# Patient Record
Sex: Female | Born: 1985 | Race: White | Hispanic: No | Marital: Single | Smoking: Current every day smoker
Health system: Southern US, Community
[De-identification: ages and names within clinical notes are randomized; demographics above are authoritative.]

## PROBLEM LIST (undated history)

## (undated) ENCOUNTER — Emergency Department (HOSPITAL_COMMUNITY): Disposition: A | Discharge status: Home / Self Care | Payer: No Typology Code available for payment source

## (undated) DIAGNOSIS — K759 Inflammatory liver disease, unspecified: Secondary | ICD-10-CM

## (undated) DIAGNOSIS — B958 Unspecified staphylococcus as the cause of diseases classified elsewhere: Secondary | ICD-10-CM

## (undated) DIAGNOSIS — F419 Anxiety disorder, unspecified: Secondary | ICD-10-CM

## (undated) DIAGNOSIS — A4902 Methicillin resistant Staphylococcus aureus infection, unspecified site: Secondary | ICD-10-CM

## (undated) HISTORY — DX: Unspecified staphylococcus as the cause of diseases classified elsewhere: B95.8

## (undated) HISTORY — PX: NO PAST SURGERIES: SHX2092

## (undated) HISTORY — DX: Inflammatory liver disease, unspecified: K75.9

---

## 2007-12-15 ENCOUNTER — Emergency Department (HOSPITAL_COMMUNITY): Admission: EM | Admit: 2007-12-15 | Discharge: 2007-12-15 | Payer: Self-pay | Admitting: Emergency Medicine

## 2007-12-17 ENCOUNTER — Emergency Department (HOSPITAL_COMMUNITY): Admission: EM | Admit: 2007-12-17 | Discharge: 2007-12-17 | Payer: Self-pay | Admitting: Emergency Medicine

## 2008-11-04 ENCOUNTER — Emergency Department (HOSPITAL_COMMUNITY): Admission: EM | Admit: 2008-11-04 | Discharge: 2008-11-04 | Payer: Self-pay | Admitting: Emergency Medicine

## 2009-05-26 ENCOUNTER — Emergency Department (HOSPITAL_COMMUNITY): Admission: EM | Admit: 2009-05-26 | Discharge: 2009-05-26 | Payer: Self-pay | Admitting: Emergency Medicine

## 2009-07-02 ENCOUNTER — Ambulatory Visit: Payer: Self-pay | Admitting: Family

## 2009-07-02 ENCOUNTER — Inpatient Hospital Stay (HOSPITAL_COMMUNITY): Admission: AD | Admit: 2009-07-02 | Discharge: 2009-07-05 | Payer: Self-pay | Admitting: Obstetrics & Gynecology

## 2010-01-26 ENCOUNTER — Emergency Department (HOSPITAL_COMMUNITY)
Admission: EM | Admit: 2010-01-26 | Discharge: 2010-01-26 | Payer: Self-pay | Source: Home / Self Care | Admitting: Emergency Medicine

## 2010-04-19 LAB — CBC
Platelets: 134 10*3/uL — ABNORMAL LOW (ref 150–400)
RDW: 13.1 % (ref 11.5–15.5)
WBC: 10.8 10*3/uL — ABNORMAL HIGH (ref 4.0–10.5)

## 2010-04-19 LAB — RAPID URINE DRUG SCREEN, HOSP PERFORMED
Cocaine: NOT DETECTED
Opiates: NOT DETECTED
Tetrahydrocannabinol: NOT DETECTED

## 2010-04-19 LAB — RPR: RPR Ser Ql: NONREACTIVE

## 2010-11-03 LAB — CULTURE, ROUTINE-ABSCESS

## 2011-09-01 ENCOUNTER — Emergency Department (HOSPITAL_COMMUNITY): Payer: Self-pay

## 2011-09-01 ENCOUNTER — Encounter (HOSPITAL_COMMUNITY): Payer: Self-pay | Admitting: *Deleted

## 2011-09-01 ENCOUNTER — Emergency Department (HOSPITAL_COMMUNITY)
Admission: EM | Admit: 2011-09-01 | Discharge: 2011-09-01 | Disposition: A | Discharge status: Home / Self Care | Payer: Self-pay | Attending: Emergency Medicine | Admitting: Emergency Medicine

## 2011-09-01 DIAGNOSIS — J40 Bronchitis, not specified as acute or chronic: Secondary | ICD-10-CM | POA: Insufficient documentation

## 2011-09-01 DIAGNOSIS — F17200 Nicotine dependence, unspecified, uncomplicated: Secondary | ICD-10-CM | POA: Insufficient documentation

## 2011-09-01 DIAGNOSIS — Z720 Tobacco use: Secondary | ICD-10-CM

## 2011-09-01 LAB — CBC WITH DIFFERENTIAL/PLATELET
Basophils Absolute: 0 10*3/uL (ref 0.0–0.1)
Eosinophils Absolute: 0.1 10*3/uL (ref 0.0–0.7)
Eosinophils Relative: 1 % (ref 0–5)
Lymphocytes Relative: 16 % (ref 12–46)
Lymphs Abs: 1.2 10*3/uL (ref 0.7–4.0)
MCH: 30.4 pg (ref 26.0–34.0)
MCV: 84.4 fL (ref 78.0–100.0)
Neutrophils Relative %: 76 % (ref 43–77)
Platelets: 182 10*3/uL (ref 150–400)
RBC: 4.87 MIL/uL (ref 3.87–5.11)
RDW: 12.2 % (ref 11.5–15.5)
WBC: 7.8 10*3/uL (ref 4.0–10.5)

## 2011-09-01 LAB — BASIC METABOLIC PANEL
Calcium: 9.7 mg/dL (ref 8.4–10.5)
GFR calc non Af Amer: >90 mL/min (ref >90)
Potassium: 3.6 mEq/L (ref 3.5–5.1)
Sodium: 138 mEq/L (ref 135–145)

## 2011-09-01 LAB — D-DIMER, QUANTITATIVE: D-Dimer, Quant: 0.44 ug/mL-FEU (ref 0.00–0.48)

## 2011-09-01 MED ORDER — ALBUTEROL SULFATE HFA 108 (90 BASE) MCG/ACT IN AERS
2.0000 | INHALATION_SPRAY | RESPIRATORY_TRACT | Status: DC | PRN
  Administered 2011-09-01: 2 via RESPIRATORY_TRACT
  Filled 2011-09-01: qty 6.7

## 2011-09-01 MED ORDER — PREDNISONE 20 MG PO TABS: 20.0000 mg | ORAL_TABLET | Freq: Two times a day (BID) | ORAL | Status: AC

## 2011-09-01 MED ORDER — PREDNISONE 20 MG PO TABS
60.0000 mg | ORAL_TABLET | Freq: Once | ORAL | Status: AC
  Administered 2011-09-01: 60 mg via ORAL
  Filled 2011-09-01: qty 3

## 2011-09-01 MED ORDER — AEROCHAMBER Z-STAT PLUS/MEDIUM MISC
1.0000 | Freq: Once | Status: AC
  Administered 2011-09-01: 1

## 2011-09-01 MED ORDER — IPRATROPIUM BROMIDE 0.02 % IN SOLN
0.5000 mg | Freq: Once | RESPIRATORY_TRACT | Status: AC
  Administered 2011-09-01: 0.5 mg via RESPIRATORY_TRACT
  Filled 2011-09-01: qty 2.5

## 2011-09-01 MED ORDER — ALBUTEROL SULFATE (5 MG/ML) 0.5% IN NEBU
5.0000 mg | INHALATION_SOLUTION | Freq: Once | RESPIRATORY_TRACT | Status: AC
  Administered 2011-09-01: 5 mg via RESPIRATORY_TRACT
  Filled 2011-09-01: qty 1

## 2011-09-01 NOTE — ED Notes (Signed)
Cough, nasal congestion , body aches

## 2011-09-01 NOTE — ED Provider Notes (Signed)
History     CSN: [id number]  Arrival date & time 09/01/11  1345   First MD Initiated Contact with Patient 09/01/11 1354      Chief Complaint  Patient presents with  . Cough    (Consider location/radiation/quality/duration/timing/severity/associated sxs/prior treatment) HPI Comments: Misty Reynolds is a 26 y.o. Female who complains of cough, nonproductive, nasal congestion, myalgias, chest pain, dyspnea, and shortness of breath with walking. Symptoms, onset 2 days ago. She smokes cigarettes, takes oral contraceptives. Has never had asthma, or bronchitis. No nausea, vomiting, weakness, or dizziness. No palliative factors.   Patient is a 26 y.o. female presenting with cough. The history is provided by the patient.  Cough    History reviewed. No pertinent past medical history.  History reviewed. No pertinent past surgical history.  History reviewed. No pertinent family history.  History  Substance Use Topics  . Smoking status: Current Everyday Smoker  . Smokeless tobacco: Not on file  . Alcohol Use: No    OB History    Grav Para Term Preterm Abortions TAB SAB Ect Mult Living                  Review of Systems  Respiratory: Positive for cough.   All other systems reviewed and are negative.    Allergies  Review of patient's allergies indicates no known allergies.  Home Medications   Current Outpatient Rx  Name Route Sig Dispense Refill  . ALPRAZOLAM 0.5 MG PO TABS Oral Take 0.5 mg by mouth 3 (three) times daily.    Marland Kitchen PREDNISONE 20 MG PO TABS Oral Take 1 tablet (20 mg total) by mouth 2 (two) times daily. 10 tablet 0    BP 129/78  Pulse 81  Temp 98.2 F (36.8 C) (Oral)  Resp 20  Ht 5\' 6"  (1.676 m)  Wt 148 lb (67.132 kg)  BMI 23.89 kg/m2  SpO2 98%  LMP 08/31/2011  Physical Exam  Nursing note and vitals reviewed. Constitutional: She is oriented to person, place, and time. She appears well-developed and well-nourished.  HENT:  Head: Normocephalic and  atraumatic.  Eyes: Conjunctivae and EOM are normal. Pupils are equal, round, and reactive to light.  Neck: Normal range of motion and phonation normal. Neck supple.  Cardiovascular: Normal rate, regular rhythm and intact distal pulses.   Pulmonary/Chest: She is in respiratory distress (Mild). She has wheezes. She has no rales. She exhibits no tenderness.       Bilateral rhonchi  Abdominal: Soft. She exhibits no distension. There is no tenderness. There is no guarding.  Musculoskeletal: Normal range of motion.  Neurological: She is alert and oriented to person, place, and time. She has normal strength. She exhibits normal muscle tone.  Skin: Skin is warm and dry.  Psychiatric: She has a normal mood and affect. Her behavior is normal. Judgment and thought content normal.    ED Course  Procedures (including critical care time) Emergency department treatment: Nebulizer, and prednisone.  Reevaluation: 15:47- patient feels better. Repeat vital signs are improved.      Labs Reviewed  BASIC METABOLIC PANEL - Abnormal; Notable for the following:    BUN 4 (*)     All other components within normal limits  CBC WITH DIFFERENTIAL  D-DIMER, QUANTITATIVE   Dg Chest 2 View  09/01/2011  *RADIOLOGY REPORT*  Clinical Data:  Cough, congestion, fever, body aches  CHEST - 2 VIEW  Comparison: None  Findings: The lungs are well-aerated.  Negative for pulmonary edema, focal  airspace consolidation, pulmonary nodule, pleural effusion or pneumothorax.  The cardiac and mediastinal silhouettes within normal limits.  No acute osseous abnormality.  Visualized upper abdomen is unremarkable.  IMPRESSION:  1.  No acute cardiopulmonary disease.  2. Normal chest radiograph.  Original Report Authenticated By: HEATH     1. Tobacco abuse   2. Bronchitis       MDM  Evaluation is consistent with bronchitis. Doubt pneumonia, metabolic instability, or impending vascular collapse.    Plan: Home Medications-  Prednisone, Albuterol; Home Treatments- Stop smoking; Recommended follow up- PCP of choice asap        Flint Melter, MD 09/01/11 1615

## 2011-09-25 ENCOUNTER — Emergency Department (HOSPITAL_COMMUNITY): Payer: Self-pay

## 2011-09-25 ENCOUNTER — Encounter (HOSPITAL_COMMUNITY): Payer: Self-pay | Admitting: *Deleted

## 2011-09-25 ENCOUNTER — Emergency Department (HOSPITAL_COMMUNITY)
Admission: EM | Admit: 2011-09-25 | Discharge: 2011-09-25 | Disposition: A | Discharge status: Home / Self Care | Payer: Self-pay | Attending: Emergency Medicine | Admitting: Emergency Medicine

## 2011-09-25 DIAGNOSIS — L089 Local infection of the skin and subcutaneous tissue, unspecified: Secondary | ICD-10-CM | POA: Insufficient documentation

## 2011-09-25 DIAGNOSIS — F17200 Nicotine dependence, unspecified, uncomplicated: Secondary | ICD-10-CM | POA: Insufficient documentation

## 2011-09-25 DIAGNOSIS — L600 Ingrowing nail: Secondary | ICD-10-CM | POA: Insufficient documentation

## 2011-09-25 MED ORDER — HYDROCODONE-ACETAMINOPHEN 5-325 MG PO TABS
2.0000 | ORAL_TABLET | Freq: Once | ORAL | Status: AC
  Administered 2011-09-25: 2 via ORAL
  Filled 2011-09-25: qty 2

## 2011-09-25 MED ORDER — PROMETHAZINE HCL 12.5 MG PO TABS
12.5000 mg | ORAL_TABLET | Freq: Once | ORAL | Status: AC
  Administered 2011-09-25: 12.5 mg via ORAL
  Filled 2011-09-25: qty 1

## 2011-09-25 MED ORDER — SULFAMETHOXAZOLE-TMP DS 800-160 MG PO TABS
1.0000 | ORAL_TABLET | Freq: Once | ORAL | Status: AC
  Administered 2011-09-25: 1 via ORAL
  Filled 2011-09-25: qty 1

## 2011-09-25 MED ORDER — HYDROCODONE-ACETAMINOPHEN 10-325 MG PO TABS: 2.0000 | ORAL_TABLET | Freq: Once | ORAL | Status: DC

## 2011-09-25 MED ORDER — BACITRACIN ZINC 500 UNIT/GM EX OINT
TOPICAL_OINTMENT | CUTANEOUS | Status: AC
  Administered 2011-09-25: 23:00:00
  Filled 2011-09-25: qty 0.9

## 2011-09-25 MED ORDER — MUPIROCIN CALCIUM 2 % EX CREA
TOPICAL_CREAM | Freq: Two times a day (BID) | CUTANEOUS | Status: DC
  Administered 2011-09-25: 23:00:00 via TOPICAL
  Filled 2011-09-25: qty 15

## 2011-09-25 MED ORDER — BACITRACIN-NEOMYCIN-POLYMYXIN 400-5-5000 EX OINT
TOPICAL_OINTMENT | CUTANEOUS | Status: AC
  Filled 2011-09-25: qty 1

## 2011-09-25 MED ORDER — LIDOCAINE HCL (PF) 1 % IJ SOLN
5.0000 mL | Freq: Once | INTRAMUSCULAR | Status: AC
  Administered 2011-09-25: 5 mL
  Filled 2011-09-25: qty 5

## 2011-09-25 MED ORDER — HYDROCODONE-ACETAMINOPHEN 5-325 MG PO TABS: ORAL_TABLET | ORAL | Status: DC

## 2011-09-25 MED ORDER — SULFAMETHOXAZOLE-TRIMETHOPRIM 800-160 MG PO TABS: 1.0000 | ORAL_TABLET | Freq: Two times a day (BID) | ORAL | Status: AC

## 2011-09-25 NOTE — ED Notes (Signed)
Dressings applied to left 5 th toe and right 4 th finger. Pt tolerated well.

## 2011-09-25 NOTE — ED Provider Notes (Signed)
History     CSN: [id number]  Arrival date & time 09/25/11  2033   First MD Initiated Contact with Patient 09/25/11 2135      Chief Complaint  Patient presents with  . Toe Pain  . Hand Pain    (Consider location/radiation/quality/duration/timing/severity/associated sxs/prior treatment) HPI Comments: Patient presents to the emergency department with complaint of swelling and pain from an ingrown nail in the right fourth finger nail. Patient also complains of pain under the left fifth toe with some swelling. The patient states that the pain and swelling of the right hand and finger have been going on for some time female that is getting progressively worse. She request that the nail be taken off completely because she's had a problem with this in the past and she got along well with the nail being removed. She has not had fever or chills related to this. There's been no red streaks going up the hand.  The patient states that in the last 4 days she has been noticing increasing swelling and soreness of the left fifth toe and under the left fifth toe. She does not recall injury but is not sure if she may have injured the toe or not. She states it feels as though there is some problem in between the toe as well. She has not taken any medications for either of these 2 problems.  Patient is a 26 y.o. female presenting with hand pain. The history is provided by the patient.  Hand Pain Pertinent negatives include no abdominal pain, arthralgias, chest pain, coughing or neck pain.    History reviewed. No pertinent past medical history.  History reviewed. No pertinent past surgical history.  No family history on file.  History  Substance Use Topics  . Smoking status: Current Everyday Smoker -- 0.5 packs/day    Types: Cigarettes  . Smokeless tobacco: Not on file  . Alcohol Use: No    OB History    Grav Para Term Preterm Abortions TAB SAB Ect Mult Living                  Review of Systems    Constitutional: Negative for activity change.       All ROS Neg except as noted in HPI  HENT: Negative for nosebleeds and neck pain.   Eyes: Negative for photophobia and discharge.  Respiratory: Negative for cough, shortness of breath and wheezing.   Cardiovascular: Negative for chest pain and palpitations.  Gastrointestinal: Negative for abdominal pain and blood in stool.  Genitourinary: Negative for dysuria, frequency and hematuria.  Musculoskeletal: Negative for back pain and arthralgias.  Skin: Negative.   Neurological: Negative for dizziness, seizures and speech difficulty.  Psychiatric/Behavioral: Positive for disturbed wake/sleep cycle. Negative for hallucinations and confusion. The patient is nervous/anxious.     Allergies  Review of patient's allergies indicates no known allergies.  Home Medications   Current Outpatient Rx  Name Route Sig Dispense Refill  . ALPRAZOLAM 0.5 MG PO TABS Oral Take 0.5 mg by mouth 3 (three) times daily.      BP 113/67  Pulse 104  Temp 98.4 F (36.9 C) (Oral)  Resp 22  Ht 5\' 6"  (1.676 m)  Wt 150 lb (68.04 kg)  BMI 24.21 kg/m2  SpO2 100%  LMP 09/05/2011  Physical Exam  Nursing note and vitals reviewed. Constitutional: She is oriented to person, place, and time. She appears well-developed and well-nourished.  Non-toxic appearance.  HENT:  Head: Normocephalic.  Right Ear:  Tympanic membrane and external ear normal.  Left Ear: Tympanic membrane and external ear normal.  Eyes: EOM and lids are normal. Pupils are equal, round, and reactive to light.  Neck: Normal range of motion. Neck supple. Carotid bruit is not present.  Cardiovascular: Normal rate, regular rhythm, normal heart sounds, intact distal pulses and normal pulses.   Pulmonary/Chest: Breath sounds normal. No respiratory distress.  Abdominal: Soft. Bowel sounds are normal. There is no tenderness. There is no guarding.  Musculoskeletal: Normal range of motion.       There is an  ingrown nail of the lateral aspect of the right fourth finger. There is mild to moderate increased redness and swelling present. There is full range of motion of the fourth finger. There is no red streaks going up the hand. There is good capillary refill present.  There is athlete's foot rash between the toes of the left foot. There is secondary shallow lacerations under the left fifth toe and in between the fourth and fifth toe. There is soreness to palpation but no red streaking and no drainage appreciated. The foot is not hot. The radial pulse is 2+ and symmetrical.  Lymphadenopathy:       Head (right side): No submandibular adenopathy present.       Head (left side): No submandibular adenopathy present.    She has no cervical adenopathy.  Neurological: She is alert and oriented to person, place, and time. She has normal strength. No cranial nerve deficit or sensory deficit.  Skin: Skin is warm and dry.  Psychiatric: Her speech is normal. Her mood appears anxious.    ED Course  Procedures : REMOVAL OF INGROWN NAIL RIGHT 4TH FINGER. - The patient is identified by arm band. Permission for the procedure is given by the patient. Procedural time out taken before removal of ingrown nail of the right fourth finger. The right fourth finger was painted with Betadine. A digital block was achieved with 1% plain lidocaine. Using sterile technique the lateral corner of the nail was removed a very small amount of pus like material was removed from the tip of the finger. It was then irrigated and painted again with Betadine and a dressing applied. Patient tolerated the procedure without problem.  Labs Reviewed - No data to display Dg Toe 5th Left  09/25/2011  *RADIOLOGY REPORT*  Clinical Data: Pain and burning for weeks.  DG TOE 5TH LEFT  Comparison: None.  Findings: Imaged bones, joints and soft tissues appear normal.  IMPRESSION: Negative exam   Original Report Authenticated By: Bernadene Bell. D'ALESSIO, M.D.       1. Ingrown nail of fourth finger of right hand   2. Skin infection       MDM  I have reviewed nursing notes, vital signs, and all appropriate lab and imaging results for this patient. Patient presented to the emergency department with an ingrown nail of the right fourth finger. The ingrown nail was removed and dressing applied. The patient has a secondary infection from small lacerations under the left fifth toe in between the fourth and fifth toe. An x-ray of the fifth toe is negative for fracture or dislocation. There is no  air seen on the x-ray. The patient is to reviewed with Septra DS one twice daily Bactroban cream to the left foot daily, and Norco 5 mg one every 4 hours as needed for pain #15.       Kathie Dike, [place] 09/25/11 2251

## 2011-09-25 NOTE — ED Provider Notes (Signed)
Medical screening examination/treatment/procedure(s) were performed by non-physician practitioner and as supervising physician I was immediately available for consultation/collaboration.   Shelda Jakes, MD 09/25/11 [phone]

## 2011-09-25 NOTE — ED Notes (Signed)
Pt presents with left 5 th toe pain and swelling. Also c/o ingrown 4 th fingernail. Pt states has had ingrown nails in past.

## 2011-10-20 ENCOUNTER — Emergency Department (HOSPITAL_COMMUNITY): Payer: Self-pay

## 2011-10-20 ENCOUNTER — Encounter (HOSPITAL_COMMUNITY): Payer: Self-pay | Admitting: *Deleted

## 2011-10-20 ENCOUNTER — Emergency Department (HOSPITAL_COMMUNITY)
Admission: EM | Admit: 2011-10-20 | Discharge: 2011-10-21 | Disposition: A | Discharge status: Home / Self Care | Payer: Self-pay | Attending: Emergency Medicine | Admitting: Emergency Medicine

## 2011-10-20 DIAGNOSIS — S161XXA Strain of muscle, fascia and tendon at neck level, initial encounter: Secondary | ICD-10-CM

## 2011-10-20 DIAGNOSIS — M542 Cervicalgia: Secondary | ICD-10-CM | POA: Insufficient documentation

## 2011-10-20 DIAGNOSIS — S335XXA Sprain of ligaments of lumbar spine, initial encounter: Secondary | ICD-10-CM | POA: Insufficient documentation

## 2011-10-20 DIAGNOSIS — S39012A Strain of muscle, fascia and tendon of lower back, initial encounter: Secondary | ICD-10-CM

## 2011-10-20 DIAGNOSIS — S0003XA Contusion of scalp, initial encounter: Secondary | ICD-10-CM | POA: Insufficient documentation

## 2011-10-20 DIAGNOSIS — S0181XA Laceration without foreign body of other part of head, initial encounter: Secondary | ICD-10-CM

## 2011-10-20 DIAGNOSIS — S139XXA Sprain of joints and ligaments of unspecified parts of neck, initial encounter: Secondary | ICD-10-CM | POA: Insufficient documentation

## 2011-10-20 DIAGNOSIS — S0083XA Contusion of other part of head, initial encounter: Secondary | ICD-10-CM

## 2011-10-20 DIAGNOSIS — S0180XA Unspecified open wound of other part of head, initial encounter: Secondary | ICD-10-CM | POA: Insufficient documentation

## 2011-10-20 HISTORY — DX: Anxiety disorder, unspecified: F41.9

## 2011-10-20 MED ORDER — ONDANSETRON 4 MG PO TBDP
4.0000 mg | ORAL_TABLET | Freq: Once | ORAL | Status: AC
  Administered 2011-10-20: 4 mg via ORAL
  Filled 2011-10-20: qty 1

## 2011-10-20 MED ORDER — HYDROMORPHONE HCL PF 1 MG/ML IJ SOLN
1.0000 mg | Freq: Once | INTRAMUSCULAR | Status: AC
  Administered 2011-10-20: 1 mg via INTRAMUSCULAR
  Filled 2011-10-20: qty 1
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
## 2011-10-20 NOTE — ED Notes (Signed)
Family states pt passed out x2. Pt states toes are still numb. Pt left on backboard until seen by EDP.
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
## 2011-10-20 NOTE — ED Notes (Signed)
Family called requesting pain medication for pt. Advised pt has to be seen by EDP & a order placed before any medications can be given. Family of at this time. Pt shows NAD at this time.

## 2011-10-21 MED ORDER — OXYCODONE-ACETAMINOPHEN 5-325 MG PO TABS: ORAL_TABLET | ORAL | Status: DC

## 2011-10-21 NOTE — ED Notes (Signed)
Lac on the right side of forehead, cleaned w/ shurclens. Bleeding still controled.

## 2011-10-21 NOTE — ED Notes (Signed)
Pt alert & oriented x4, stable gait. Patient given discharge instructions, paperwork & prescription(s). Patient  instructed to stop at the registration desk to finish any additional paperwork. Patient verbalized understanding. Pt left department w/ no further questions. 

## 2011-10-21 NOTE — ED Notes (Signed)
Patient requested to use the restroom. Patient not cleared to ambulate at this time, offered patient to used bedpan. Patient asked for family to assist her with using the bedpan.

## 2011-10-22 NOTE — ED Provider Notes (Signed)
Medical screening examination/treatment/procedure(s) were performed by non-physician practitioner and as supervising physician I was immediately available for consultation/collaboration.   Laray Anger, DO 10/22/11 1839

## 2012-02-01 NOTE — L&D Delivery Note (Signed)
Delivery Note At 6:48 PM a viable female was delivered via Vaginal, Spontaneous Delivery (Presentation: Left Occiput Anterior).  APGAR: Pending, crying at perineum , ; weight .  Delivered through thick meconium. Deep suction at warmer because of poost saturations. Placenta status: Intact, Spontaneous.  Cord: 3 vessels with the following complications: None.    Anesthesia: None  Episiotomy: None Lacerations: None Suture Repair: NA Est. Blood Loss (mL): 350  Mom to postpartum.  Baby to Nursery evaluation for inability to maintain O2 sats.  Tawana Scale 11/13/2012, 7:03 PM

## 2012-02-05 ENCOUNTER — Emergency Department (HOSPITAL_COMMUNITY)
Admission: EM | Admit: 2012-02-05 | Discharge: 2012-02-05 | Disposition: A | Discharge status: Home / Self Care | Payer: Self-pay | Attending: Emergency Medicine | Admitting: Emergency Medicine

## 2012-02-05 ENCOUNTER — Encounter (HOSPITAL_COMMUNITY): Payer: Self-pay | Admitting: *Deleted

## 2012-02-05 DIAGNOSIS — N644 Mastodynia: Secondary | ICD-10-CM | POA: Insufficient documentation

## 2012-02-05 DIAGNOSIS — F17200 Nicotine dependence, unspecified, uncomplicated: Secondary | ICD-10-CM | POA: Insufficient documentation

## 2012-02-05 DIAGNOSIS — M545 Low back pain, unspecified: Secondary | ICD-10-CM | POA: Insufficient documentation

## 2012-02-05 DIAGNOSIS — N6459 Other signs and symptoms in breast: Secondary | ICD-10-CM | POA: Insufficient documentation

## 2012-02-05 DIAGNOSIS — R071 Chest pain on breathing: Secondary | ICD-10-CM | POA: Insufficient documentation

## 2012-02-05 DIAGNOSIS — R0789 Other chest pain: Secondary | ICD-10-CM

## 2012-02-05 DIAGNOSIS — N6452 Nipple discharge: Secondary | ICD-10-CM

## 2012-02-05 DIAGNOSIS — R0602 Shortness of breath: Secondary | ICD-10-CM | POA: Insufficient documentation

## 2012-02-05 DIAGNOSIS — Z8619 Personal history of other infectious and parasitic diseases: Secondary | ICD-10-CM | POA: Insufficient documentation

## 2012-02-05 DIAGNOSIS — Z79899 Other long term (current) drug therapy: Secondary | ICD-10-CM | POA: Insufficient documentation

## 2012-02-05 DIAGNOSIS — F411 Generalized anxiety disorder: Secondary | ICD-10-CM | POA: Insufficient documentation

## 2012-02-05 DIAGNOSIS — M79672 Pain in left foot: Secondary | ICD-10-CM

## 2012-02-05 MED ORDER — CYCLOBENZAPRINE HCL 10 MG PO TABS: 10.0000 mg | ORAL_TABLET | Freq: Three times a day (TID) | ORAL | Status: DC | PRN

## 2012-02-05 MED ORDER — SULFAMETHOXAZOLE-TMP DS 800-160 MG PO TABS
1.0000 | ORAL_TABLET | Freq: Once | ORAL | Status: AC
  Administered 2012-02-05: 1 via ORAL
  Filled 2012-02-05: qty 1

## 2012-02-05 MED ORDER — TRAMADOL HCL 50 MG PO TABS
100.0000 mg | ORAL_TABLET | Freq: Once | ORAL | Status: AC
  Administered 2012-02-05: 100 mg via ORAL
  Filled 2012-02-05: qty 2

## 2012-02-05 MED ORDER — CYCLOBENZAPRINE HCL 10 MG PO TABS
10.0000 mg | ORAL_TABLET | Freq: Once | ORAL | Status: AC
  Administered 2012-02-05: 10 mg via ORAL
  Filled 2012-02-05: qty 1

## 2012-02-05 MED ORDER — TRAMADOL HCL 50 MG PO TABS: 100.0000 mg | ORAL_TABLET | Freq: Four times a day (QID) | ORAL | Status: DC | PRN

## 2012-02-05 MED ORDER — KETOROLAC TROMETHAMINE 60 MG/2ML IM SOLN
60.0000 mg | Freq: Once | INTRAMUSCULAR | Status: AC
  Administered 2012-02-05: 60 mg via INTRAMUSCULAR
  Filled 2012-02-05: qty 2

## 2012-02-05 MED ORDER — SULFAMETHOXAZOLE-TRIMETHOPRIM 800-160 MG PO TABS: 1.0000 | ORAL_TABLET | Freq: Two times a day (BID) | ORAL | Status: AC

## 2012-02-05 NOTE — ED Provider Notes (Signed)
History   This chart was scribed for Ward Givens, MD scribed by Magnus Sinning. The patient was seen in room APA12/APA12 at 19:06   CSN: [id number]  Arrival date & time 02/05/12  1758    Chief Complaint  Patient presents with  . Leg Pain  . Breast Pain    (Consider location/radiation/quality/duration/timing/severity/associated sxs/prior treatment) Patient is a 27 y.o. female presenting with leg pain. The history is provided by the patient. No language interpreter was used.  Leg Pain    TAISIA FANTINI is a 27 y.o. female who presents to the Emergency Department complaining of constant moderate sharp tight left-sided CP, onset one week ago with associated SOB and lower back pain.  Pt explains the CP that is described as tightness and is aggravated with breathing and movement,  Says she was seen 2 months ago for a staph infection in left breast. She says she completed a rx [facility] 10 day C[facility] one [facility] ago, and states it did not provide any improvement.   Patient says lower middle back pain that has been hurting for past week along with CP. She states the back pain is modified when sitting up straight, and not bearing weight to back. Also notes that back pain is aggravated when bending forward and toward either left or right side, more prominent on the right.  Pt also has c/o of constant moderate foot pain, onset 5-6 months. She states there is a bump on the bottom of her left foot that intitally appeared as a pimple. States it is better now.   Pt denies cough, fever, or any recent lifting or physical exertion or injury.   PCP: [facility] Department    Past Medical History  Diagnosis Date  . Anxiety     History reviewed. No pertinent past surgical history.  No family history on file.  History  Substance Use Topics  . Smoking status: Current Every Day Smoker -- 0.5 packs/day    Types: Cigarettes  . Smokeless tobacco: Not on file  . Alcohol  Use: No  Patient states she smokes 4-5  Patient is employed as a Marine scientist.  Review of Systems  Constitutional: Negative for fever.  Respiratory: Positive for shortness of breath. Negative for cough.   Cardiovascular: Positive for chest pain.  Musculoskeletal: Positive for back pain.  All other systems reviewed and are negative.    Allergies  Review of patient's allergies indicates no known allergies.  Home Medications   Current Outpatient Rx  Name  Route  Sig  Dispense  Refill  . ALPRAZOLAM 0.5 MG PO TABS   Oral   Take 0.5 mg by mouth 3 (three) times daily.         Marland Kitchen NORGESTIM-ETH ESTRAD TRIPHASIC 0.18/0.215/0.25 MG-35 MCG PO TABS   Oral   Take 1 tablet by mouth daily.           BP 133/80  Pulse 89  Temp 97.8 F (36.6 C) (Oral)  Resp 24  Ht 5' 6.6" (1.692 m)  Wt 143 lb (64.864 kg)  BMI 22.67 kg/m2  SpO2 99%  LMP 01/15/2012  Vital signs normal    Physical Exam  Nursing note and vitals reviewed. Constitutional: She is oriented to person, place, and time. She appears well-developed and well-nourished. No distress.  HENT:  Head: Normocephalic and atraumatic.  Right Ear: External ear normal.  Left Ear: External ear normal.  Nose: Nose normal.  Mouth/Throat: Oropharynx is  clear and moist.  Eyes: Conjunctivae normal and EOM are normal. Pupils are equal, round, and reactive to light.  Neck: Normal range of motion. Neck supple. No tracheal deviation present.  Cardiovascular: Normal rate, regular rhythm and normal heart sounds.   Pulmonary/Chest: Effort normal and breath sounds normal. No respiratory distress. She has no wheezes. She has no rales. She exhibits no tenderness.         When she expresses her left nipple, there is some thin white drainage.   Abdominal: She exhibits no distension.  Musculoskeletal: Normal range of motion.       Back:       Tender in the sacral area bilaterally.Nontender in the thoracic or lumbar spine. Has  pain on ROM of the waist with forward flexion and less so to fexion to the right, no pain with flexion to the left.   Neurological: She is alert and oriented to person, place, and time. No sensory deficit.  Skin: Skin is warm and dry.       On bottom of the left foot on the mid portion location of the MTP joint there is some scaly skin. No induration, no redness, and no drainage. No lesions between her toes as husband state there was.   Psychiatric: She has a normal mood and affect. Her behavior is normal.    ED Course  Procedures (including critical care time)   Medications  sulfamethoxazole-trimethoprim (BACTRIM DS) 800-160 MG per tablet 1 tablet (not administered)  ketorolac (TORADOL) injection 60 mg (60 mg Intramuscular Given 02/05/12 1934)  cyclobenzaprine (FLEXERIL) tablet 10 mg (10 mg Oral Given 02/05/12 1933)  traMADol (ULTRAM) tablet 100 mg (100 mg Oral Given 02/05/12 1933)    DIAGNOSTIC STUDIES: Oxygen Saturation is 99% on room air, normal by my interpretation.    COORDINATION OF CARE: 19:14: Physical exam performed.    1. LBP (low back pain)   2. Discharge from nipple   3. Chest wall pain   4. Foot pain, left    New Prescriptions   CYCLOBENZAPRINE (FLEXERIL) 10 MG TABLET    Take 1 tablet (10 mg total) by mouth 3 (three) times daily as needed for muscle spasms.   SULFAMETHOXAZOLE-TRIMETHOPRIM (BACTRIM DS,SEPTRA DS) 800-160 MG PER TABLET    Take 1 tablet by mouth 2 (two) times daily.   TRAMADOL (ULTRAM) 50 MG TABLET    Take 2 tablets (100 mg total) by mouth every 6 (six) hours as needed for pain.    Plan discharge  Devoria Albe, MD, FACEP     MDM   I personally performed the services described in this documentation, which was scribed in my presence. The recorded information has been reviewed and considered.  Devoria Albe, MD, Armando Gang         Ward Givens, MD 02/05/12 [phone]

## 2012-02-05 NOTE — ED Notes (Signed)
Pt with nipple discharge to left breast when breast squeezed, seen [facility] and has finished antibiotics for it; c/o pain to bottom of left foot; here for bruise noted to posterior of left thigh after a fall yesterday, denies hitting head

## 2012-02-05 NOTE — ED Notes (Signed)
Infection to left breast x 2 wks.  Reports taking PO abx with no relief.  Pt reports falling today and c/o pain to posterior left thigh.  Also c/o pain to left foot.

## 2012-06-27 ENCOUNTER — Other Ambulatory Visit: Payer: Self-pay | Admitting: Obstetrics & Gynecology

## 2012-06-27 DIAGNOSIS — O0932 Supervision of pregnancy with insufficient antenatal care, second trimester: Secondary | ICD-10-CM

## 2012-07-02 ENCOUNTER — Encounter: Payer: Self-pay | Admitting: *Deleted

## 2012-07-03 ENCOUNTER — Ambulatory Visit (INDEPENDENT_AMBULATORY_CARE_PROVIDER_SITE_OTHER): Payer: Self-pay

## 2012-07-03 ENCOUNTER — Other Ambulatory Visit: Payer: Self-pay | Admitting: Obstetrics & Gynecology

## 2012-07-03 DIAGNOSIS — O99320 Drug use complicating pregnancy, unspecified trimester: Secondary | ICD-10-CM

## 2012-07-03 DIAGNOSIS — F1920 Other psychoactive substance dependence, uncomplicated: Secondary | ICD-10-CM

## 2012-07-03 DIAGNOSIS — O0932 Supervision of pregnancy with insufficient antenatal care, second trimester: Secondary | ICD-10-CM

## 2012-07-03 DIAGNOSIS — O0930 Supervision of pregnancy with insufficient antenatal care, unspecified trimester: Secondary | ICD-10-CM

## 2012-07-03 NOTE — Progress Notes (Signed)
U/S-active fetus, meas c/w 20+1wks EDD 11/19/2012, fluid wnl, ant gr 1. Anat screen complete.cx closed  = 5.2 cm, female fetus

## 2012-07-11 LAB — US OB DETAIL + 14 WK

## 2012-07-17 ENCOUNTER — Encounter: Payer: Self-pay | Admitting: Women's Health

## 2012-07-17 ENCOUNTER — Other Ambulatory Visit: Payer: Self-pay | Admitting: Women's Health

## 2012-07-17 ENCOUNTER — Ambulatory Visit (INDEPENDENT_AMBULATORY_CARE_PROVIDER_SITE_OTHER): Payer: Medicaid Other | Admitting: Women's Health

## 2012-07-17 VITALS — BP 108/70 | Ht 65.0 in | Wt 164.0 lb

## 2012-07-17 DIAGNOSIS — O99330 Smoking (tobacco) complicating pregnancy, unspecified trimester: Secondary | ICD-10-CM

## 2012-07-17 DIAGNOSIS — Z3480 Encounter for supervision of other normal pregnancy, unspecified trimester: Secondary | ICD-10-CM | POA: Insufficient documentation

## 2012-07-17 DIAGNOSIS — Z3482 Encounter for supervision of other normal pregnancy, second trimester: Secondary | ICD-10-CM

## 2012-07-17 DIAGNOSIS — O0932 Supervision of pregnancy with insufficient antenatal care, second trimester: Secondary | ICD-10-CM

## 2012-07-17 DIAGNOSIS — R05 Cough: Secondary | ICD-10-CM

## 2012-07-17 DIAGNOSIS — F17200 Nicotine dependence, unspecified, uncomplicated: Secondary | ICD-10-CM | POA: Insufficient documentation

## 2012-07-17 DIAGNOSIS — Z1389 Encounter for screening for other disorder: Secondary | ICD-10-CM

## 2012-07-17 DIAGNOSIS — O99340 Other mental disorders complicating pregnancy, unspecified trimester: Secondary | ICD-10-CM

## 2012-07-17 DIAGNOSIS — O0930 Supervision of pregnancy with insufficient antenatal care, unspecified trimester: Secondary | ICD-10-CM | POA: Insufficient documentation

## 2012-07-17 DIAGNOSIS — F418 Other specified anxiety disorders: Secondary | ICD-10-CM | POA: Insufficient documentation

## 2012-07-17 DIAGNOSIS — Z331 Pregnant state, incidental: Secondary | ICD-10-CM

## 2012-07-17 DIAGNOSIS — F419 Anxiety disorder, unspecified: Secondary | ICD-10-CM

## 2012-07-17 LAB — POCT URINALYSIS DIPSTICK
Ketones, UA: NEGATIVE
Protein, UA: NEGATIVE

## 2012-07-17 MED ORDER — HYDROCODONE-HOMATROPINE 5-1.5 MG/5ML PO SYRP: 2.5000 mL | ORAL_SOLUTION | Freq: Every evening | ORAL | Status: DC | PRN

## 2012-07-17 NOTE — Progress Notes (Signed)
Low backpain. New OB packet given. Consents signed. AFP today. To sign record release for [place] to get pap smear results.

## 2012-07-17 NOTE — Progress Notes (Addendum)
  Subjective:    Misty Reynolds is a 27 y.o. G75P1001 Caucasian female at [redacted]w[redacted]d by 20.1wk u/s, being seen today for her first obstetrical visit.  Her obstetrical history is significant for smoker and late onset care, uncomplicated term svd x 1.  Pregnancy history fully reviewed. Trying to wean Xanax down, now taking 0.5mg  BID, but doesn't want to come completely off. Counseled on xanax use during pregnancy and risks to fetus, and alternative meds that are safer during pregnancy.   Patient reports persistent dry coughing nightly that keeps her up- has tried OTC cough medicine w/o relief, states same thing happened last pregnancy and she was given cough syrup which helped- requests this again.  Denies cramping, vb, lof, urinary hesitancy, frequency, urgency, or dysuria. Reports good fm.   Filed Vitals:   07/17/12 1609  BP: 108/70  Weight: 164 lb (74.39 kg)    HISTORY: OB History   Grav Para Term Preterm Abortions TAB SAB Ect Mult Living   2 1 1       1      # Outc Date GA Lbr Len/2nd Wgt Sex Del Anes PTL Lv   1 TRM 6/11 [redacted]w[redacted]d  7lb8oz(3.402kg) M SVD   Yes   2 CUR              Past Medical History  Diagnosis Date  . Anxiety   . Staph infection     left breast   Past Surgical History  Procedure Laterality Date  . No past surgeries     History reviewed. No pertinent family history.   Exam   System:     Skin: normal coloration and turgor, no rashes    Neurologic: oriented, normal mood   Extremities: normal strength, tone, and muscle mass   HEENT PERRLA   Mouth/Teeth mucous membranes moist   Cardiovascular: regular rate and rhythm   Respiratory:  appears well, vitals normal, no respiratory distress, acyanotic, normal RR   Abdomen: soft, non-tender    Thin prep pap smear not obtained, pt states she had a recent normal pap FH: 21cm FHR: 130 via doppler Assessment:    Pregnancy: G2P1001 Patient Active Problem List   Diagnosis Date Noted  . Supervision of other normal  pregnancy 07/17/2012    Priority: High  . Late prenatal care 07/17/2012    Priority: High  . Anxiety 07/17/2012    Priority: High  . Smoker 07/17/2012      [redacted]w[redacted]d G2P1001 New OB visit Smoker Anxiety Late onset care  Plan:     Initial labs drawn Continue prenatal vitamins Problem list reviewed and updated Reviewed ptl s/s, fm, warning s/s to report Reviewed recommended weight gain based on pre-gravid BMI Encouraged well-balanced diet Genetic Screening discussed Quad Screen: requested, drawn today Cystic fibrosis screening discussed declined Ultrasound discussed; fetal survey: results reviewed Rx Hycodan 2.80ml q hs prn cough, w/ 0RF To sign release to get pap results Recommended smoking cessation Follow up in 3 weeks for visit Marge Duncans 07/17/2012 4:47 PM

## 2012-07-17 NOTE — Patient Instructions (Addendum)
Pregnancy - Second Trimester The second trimester of pregnancy (3 to 6 months) is a period of rapid growth for you and your baby. At the end of the sixth month, your baby is about 9 inches long and weighs 1 1/2 pounds. You will begin to feel the baby move between 18 and 20 weeks of the pregnancy. This is called quickening. Weight gain is faster. A clear fluid (colostrum) may leak out of your breasts. You may feel small contractions of the womb (uterus). This is known as false labor or Braxton-Hicks contractions. This is like a practice for labor when the baby is ready to be born. Usually, the problems with morning sickness have usually passed by the end of your first trimester. Some women develop small dark blotches (called cholasma, mask of pregnancy) on their face that usually goes away after the baby is born. Exposure to the sun makes the blotches worse. Acne may also develop in some pregnant women and pregnant women who have acne, may find that it goes away. PRENATAL EXAMS  Blood work may continue to be done during prenatal exams. These tests are done to check on your health and the probable health of your baby. Blood work is used to follow your blood levels (hemoglobin). Anemia (low hemoglobin) is common during pregnancy. Iron and vitamins are given to help prevent this. You will also be checked for diabetes between 24 and 28 weeks of the pregnancy. Some of the previous blood tests may be repeated.  The size of the uterus is measured during each visit. This is to make sure that the baby is continuing to grow properly according to the dates of the pregnancy.  Your blood pressure is checked every prenatal visit. This is to make sure you are not getting toxemia.  Your urine is checked to make sure you do not have an infection, diabetes or protein in the urine.  Your weight is checked often to make sure gains are happening at the suggested rate. This is to ensure that both you and your baby are  growing normally.  Sometimes, an ultrasound is performed to confirm the proper growth and development of the baby. This is a test which bounces harmless sound waves off the baby so your caregiver can more accurately determine due dates. Sometimes, a test is done on the amniotic fluid surrounding the baby. This test is called an amniocentesis. The amniotic fluid is obtained by sticking a needle into the belly (abdomen). This is done to check the chromosomes in instances where there is a concern about possible genetic problems with the baby. It is also sometimes done near the end of pregnancy if an early delivery is required. In this case, it is done to help make sure the baby's lungs are mature enough for the baby to live outside of the womb. CHANGES OCCURING IN THE SECOND TRIMESTER OF PREGNANCY Your body goes through many changes during pregnancy. They vary from person to person. Talk to your caregiver about changes you notice that you are concerned about.  During the second trimester, you will likely have an increase in your appetite. It is normal to have cravings for certain foods. This varies from person to person and pregnancy to pregnancy.  Your lower abdomen will begin to bulge.  You may have to urinate more often because the uterus and baby are pressing on your bladder. It is also common to get more bladder infections during pregnancy. You can help this by drinking lots of fluids   and emptying your bladder before and after intercourse.  You may begin to get stretch marks on your hips, abdomen, and breasts. These are normal changes in the body during pregnancy. There are no exercises or medicines to take that prevent this change.  You may begin to develop swollen and bulging veins (varicose veins) in your legs. Wearing support hose, elevating your feet for 15 minutes, 3 to 4 times a day and limiting salt in your diet helps lessen the problem.  Heartburn may develop as the uterus grows and  pushes up against the stomach. Antacids recommended by your caregiver helps with this problem. Also, eating smaller meals 4 to 5 times a day helps.  Constipation can be treated with a stool softener or adding bulk to your diet. Drinking lots of fluids, and eating vegetables, fruits, and whole grains are helpful.  Exercising is also helpful. If you have been very active up until your pregnancy, most of these activities can be continued during your pregnancy. If you have been less active, it is helpful to start an exercise program such as walking.  Hemorrhoids may develop at the end of the second trimester. Warm sitz baths and hemorrhoid cream recommended by your caregiver helps hemorrhoid problems.  Backaches may develop during this time of your pregnancy. Avoid heavy lifting, wear low heal shoes, and practice good posture to help with backache problems.  Some pregnant women develop tingling and numbness of their hand and fingers because of swelling and tightening of ligaments in the wrist (carpel tunnel syndrome). This goes away after the baby is born.  As your breasts enlarge, you may have to get a bigger bra. Get a comfortable, cotton, support bra. Do not get a nursing bra until the last month of the pregnancy if you will be nursing the baby.  You may get a dark line from your belly button to the pubic area called the linea nigra.  You may develop rosy cheeks because of increase blood flow to the face.  You may develop spider looking lines of the face, neck, arms, and chest. These go away after the baby is born. HOME CARE INSTRUCTIONS   It is extremely important to avoid all smoking, herbs, alcohol, and unprescribed drugs during your pregnancy. These chemicals affect the formation and growth of the baby. Avoid these chemicals throughout the pregnancy to ensure the delivery of a healthy infant.  Most of your home care instructions are the same as suggested for the first trimester of your  pregnancy. Keep your caregiver's appointments. Follow your caregiver's instructions regarding medicine use, exercise, and diet.  During pregnancy, you are providing food for you and your baby. Continue to eat regular, well-balanced meals. Choose foods such as meat, fish, milk and other low fat dairy products, vegetables, fruits, and whole-grain breads and cereals. Your caregiver will tell you of the ideal weight gain.  A physical sexual relationship may be continued up until near the end of pregnancy if there are no other problems. Problems could include early (premature) leaking of amniotic fluid from the membranes, vaginal bleeding, abdominal pain, or other medical or pregnancy problems.  Exercise regularly if there are no restrictions. Check with your caregiver if you are unsure of the safety of some of your exercises. The greatest weight gain will occur in the last 2 trimesters of pregnancy. Exercise will help you:  Control your weight.  Get you in shape for labor and delivery.  Lose weight after you have the baby.  Wear   a good support or jogging bra for breast tenderness during pregnancy. This may help if worn during sleep. Pads or tissues may be used in the bra if you are leaking colostrum.  Do not use hot tubs, steam rooms or saunas throughout the pregnancy.  Wear your seat belt at all times when driving. This protects you and your baby if you are in an accident.  Avoid raw meat, uncooked cheese, cat litter boxes, and soil used by cats. These carry germs that can cause birth defects in the baby.  The second trimester is also a good time to visit your dentist for your dental health if this has not been done yet. Getting your teeth cleaned is okay. Use a soft toothbrush. Brush gently during pregnancy.  It is easier to leak urine during pregnancy. Tightening up and strengthening the pelvic muscles will help with this problem. Practice stopping your urination while you are going to the  bathroom. These are the same muscles you need to strengthen. It is also the muscles you would use as if you were trying to stop from passing gas. You can practice tightening these muscles up 10 times a set and repeating this about 3 times per day. Once you know what muscles to tighten up, do not perform these exercises during urination. It is more likely to contribute to an infection by backing up the urine.  Ask for help if you have financial, counseling, or nutritional needs during pregnancy. Your caregiver will be able to offer counseling for these needs as well as refer you for other special needs.  Your skin may become oily. If so, wash your face with mild soap, use non-greasy moisturizer and oil or cream based makeup. MEDICINES AND DRUG USE IN PREGNANCY  Take prenatal vitamins as directed. The vitamin should contain 1 milligram of folic acid. Keep all vitamins out of reach of children. Only a couple vitamins or tablets containing iron may be fatal to a baby or young child when ingested.  Avoid use of all medicines, including herbs, over-the-counter medicines, not prescribed or suggested by your caregiver. Only take over-the-counter or prescription medicines for pain, discomfort, or fever as directed by your caregiver. Do not use aspirin.  Let your caregiver also know about herbs you may be using.  Alcohol is related to a number of birth defects. This includes fetal alcohol syndrome. All alcohol, in any form, should be avoided completely. Smoking will cause low birth rate and premature babies.  Street or illegal drugs are very harmful to the baby. They are absolutely forbidden. A baby born to an addicted mother will be addicted at birth. The baby will go through the same withdrawal an adult does. SEEK MEDICAL CARE IF:  You have any concerns or worries during your pregnancy. It is better to call with your questions if you feel they cannot wait, rather than worry about them. SEEK IMMEDIATE  MEDICAL CARE IF:   An unexplained oral temperature above 102 F (38.9 C) develops, or as your caregiver suggests.  You have leaking of fluid from the vagina (birth canal). If leaking membranes are suspected, take your temperature and tell your caregiver of this when you call.  There is vaginal spotting, bleeding, or passing clots. Tell your caregiver of the amount and how many pads are used. Light spotting in pregnancy is common, especially following intercourse.  You develop a bad smelling vaginal discharge with a change in the color from clear to white.  You continue to feel   sick to your stomach (nauseated) and have no relief from remedies suggested. You vomit blood or coffee ground-like materials.  You lose more than 2 pounds of weight or gain more than 2 pounds of weight over 1 week, or as suggested by your caregiver.  You notice swelling of your face, hands, feet, or legs.  You get exposed to German measles and have never had them.  You are exposed to fifth disease or chickenpox.  You develop belly (abdominal) pain. Round ligament discomfort is a common non-cancerous (benign) cause of abdominal pain in pregnancy. Your caregiver still must evaluate you.  You develop a bad headache that does not go away.  You develop fever, diarrhea, pain with urination, or shortness of breath.  You develop visual problems, blurry, or double vision.  You fall or are in a car accident or any kind of trauma.  There is mental or physical violence at home. Document Released: 01/11/2001 Document Revised: 10/12/2011 Document Reviewed: 07/16/2008 [employer] Patient Information 2014 [employer].  

## 2012-07-18 LAB — AFP, QUAD SCREEN
Age Alone: 1:1050 {titer}
HCG, Total: 11791 m[IU]/mL
INH: 268.4 pg/mL
MoM for hCG: 0.91
Osb Risk: 1:2750 {titer}
Tri 18 Scr Risk Est: NEGATIVE
Trisomy 18 (Edward) Syndrome Interp.: 1:94100 {titer}
uE3 Value: 1.8 ng/mL

## 2012-07-18 LAB — DRUG SCREEN, URINE, NO CONFIRMATION
Amphetamine Screen, Ur: NEGATIVE
Barbiturate Quant, Ur: NEGATIVE
Cocaine Metabolites: NEGATIVE
Marijuana Metabolite: NEGATIVE
Opiate Screen, Urine: POSITIVE — AB

## 2012-07-18 LAB — URINALYSIS
Glucose, UA: NEGATIVE mg/dL
Hgb urine dipstick: NEGATIVE
pH: 6.5 (ref 5.0–8.0)

## 2012-07-18 LAB — CBC
Hemoglobin: 12.7 g/dL (ref 12.0–15.0)
MCH: 30.3 pg (ref 26.0–34.0)
MCV: 89 fL (ref 78.0–100.0)
RBC: 4.19 MIL/uL (ref 3.87–5.11)

## 2012-07-18 LAB — HIV ANTIBODY (ROUTINE TESTING W REFLEX): HIV: NONREACTIVE

## 2012-07-19 LAB — OPIATES/OPIOIDS (LC/MS-MS)
Codeine Urine: 70 ng/mL
Heroin (6-AM), UR: NEGATIVE ng/mL
Hydrocodone: 262 ng/mL
Hydromorphone: NEGATIVE ng/mL
Morphine Urine: NEGATIVE ng/mL

## 2012-07-19 LAB — URINE CULTURE: Colony Count: NO GROWTH

## 2012-07-19 LAB — RUBELLA SCREEN: Rubella: 1.8 Index — ABNORMAL HIGH (ref <0.90)

## 2012-07-19 LAB — VARICELLA ZOSTER ANTIBODY, IGG: Varicella IgG: 1545 Index — ABNORMAL HIGH (ref <135.00)

## 2012-07-21 ENCOUNTER — Encounter: Payer: Self-pay | Admitting: Women's Health

## 2012-07-23 ENCOUNTER — Encounter: Payer: Self-pay | Admitting: Women's Health

## 2012-07-23 ENCOUNTER — Telehealth: Payer: Self-pay | Admitting: Obstetrics and Gynecology

## 2012-07-23 NOTE — Telephone Encounter (Signed)
Pt requesting lab results, Pt informed of WNL results from 07/17/2012. Pt states thinks she has an infection in her foot, states red and swollen, hx of staph infection. Pt states been seen at [facility]. Pt offered an appt but has not able to come to office due to other appt in [place]. Pt encouraged to go to [facility] to be evaluated for an infection in her foot. Pt states foot has been swollen and with small sore on it x several weeks but not informed provider here at her appt. Reinstated to pt needs to go to [facility] to be evaluated and treated. Pt verbalized understanding.

## 2012-08-08 ENCOUNTER — Encounter: Payer: Medicaid Other | Admitting: Obstetrics and Gynecology

## 2012-08-08 ENCOUNTER — Telehealth: Payer: Self-pay | Admitting: *Deleted

## 2012-08-08 DIAGNOSIS — Z3482 Encounter for supervision of other normal pregnancy, second trimester: Secondary | ICD-10-CM

## 2012-08-08 MED ORDER — PRENATAL PLUS 27-1 MG PO TABS: 1.0000 | ORAL_TABLET | Freq: Every day | ORAL | Status: DC

## 2012-08-08 NOTE — Telephone Encounter (Signed)
Routed to Dr. Despina Hidden. JSY

## 2012-08-09 ENCOUNTER — Telehealth: Payer: Self-pay | Admitting: *Deleted

## 2012-08-09 NOTE — Telephone Encounter (Signed)
Decline refill on hycodon cough syrup

## 2012-08-10 ENCOUNTER — Telehealth: Payer: Self-pay | Admitting: *Deleted

## 2012-08-10 NOTE — Telephone Encounter (Signed)
Pt wanted to know about cough medication, called the pharmacy and was told that we would not refill it. Dr. Despina Hidden took care of this yesterday.

## 2012-08-14 ENCOUNTER — Encounter: Payer: Medicaid Other | Admitting: Obstetrics and Gynecology

## 2012-08-20 ENCOUNTER — Telehealth: Payer: Self-pay | Admitting: Obstetrics & Gynecology

## 2012-08-20 DIAGNOSIS — R05 Cough: Secondary | ICD-10-CM

## 2012-08-20 NOTE — Telephone Encounter (Signed)
Wait til assessment tomorrow to evaluate need for hycodon cough supressant

## 2012-08-20 NOTE — Telephone Encounter (Signed)
Pt aware Dr. Despina Hidden declined refill request for hycodan until appt tomorrow to reassess pt. Pt verbalized understanding.

## 2012-08-21 ENCOUNTER — Encounter: Payer: Medicaid Other | Admitting: Advanced Practice Midwife

## 2012-08-29 ENCOUNTER — Encounter: Payer: Medicaid Other | Admitting: Women's Health

## 2012-09-10 ENCOUNTER — Encounter: Payer: Medicaid Other | Admitting: Women's Health

## 2012-09-17 ENCOUNTER — Encounter: Payer: Medicaid Other | Admitting: Women's Health

## 2012-09-26 ENCOUNTER — Encounter: Payer: Medicaid Other | Admitting: Obstetrics & Gynecology

## 2012-10-08 ENCOUNTER — Encounter: Payer: Self-pay | Admitting: Advanced Practice Midwife

## 2012-10-08 ENCOUNTER — Ambulatory Visit (INDEPENDENT_AMBULATORY_CARE_PROVIDER_SITE_OTHER): Payer: Medicaid Other | Admitting: Advanced Practice Midwife

## 2012-10-08 VITALS — BP 100/70 | Wt 190.0 lb

## 2012-10-08 DIAGNOSIS — O0930 Supervision of pregnancy with insufficient antenatal care, unspecified trimester: Secondary | ICD-10-CM

## 2012-10-08 DIAGNOSIS — F1920 Other psychoactive substance dependence, uncomplicated: Secondary | ICD-10-CM

## 2012-10-08 DIAGNOSIS — Z1389 Encounter for screening for other disorder: Secondary | ICD-10-CM

## 2012-10-08 DIAGNOSIS — O99340 Other mental disorders complicating pregnancy, unspecified trimester: Secondary | ICD-10-CM

## 2012-10-08 DIAGNOSIS — Z331 Pregnant state, incidental: Secondary | ICD-10-CM

## 2012-10-08 DIAGNOSIS — O99330 Smoking (tobacco) complicating pregnancy, unspecified trimester: Secondary | ICD-10-CM

## 2012-10-08 LAB — POCT URINALYSIS DIPSTICK: Protein, UA: NEGATIVE

## 2012-10-08 NOTE — Progress Notes (Signed)
RT Inner thigh pain. Throat sore, has deep cough at night.

## 2012-10-08 NOTE — Progress Notes (Signed)
Has not been seen since June--no transportation.   Has had a sore throat for a few days.  Has had a dry cough for a few weeks.  FOB really pushing for a rx for hycodan and pain medicine.   No other symptoms.  Throat looks fine except for cobblestone appearance.  Try zyrtec C/O Right leg being sore.  No erythema, edema, Homan's sign negative.  Tips for musculoskeletal pain discussed.  Gargle with salt water.  Routine questions about pregnancy answered.  F/U asap for PN2.

## 2012-10-08 NOTE — Patient Instructions (Addendum)
Musculoskeletal Pain Musculoskeletal pain is muscle and boney aches and pains. These pains can occur in any part of the body. Your caregiver may treat you without knowing the cause of the pain. They may treat you if blood or urine tests, X-rays, and other tests were normal.  CAUSES There is often not a definite cause or reason for these pains. These pains may be caused by a type of germ (virus). The discomfort may also come from overuse. Overuse includes working out too hard when your body is not fit. Boney aches also come from weather changes. Bone is sensitive to atmospheric pressure changes. HOME CARE INSTRUCTIONS   Ask when your test results will be ready. Make sure you get your test results.  Only take over-the-counter or prescription medicines for pain, discomfort, or fever as directed by your caregiver. If you were given medications for your condition, do not drive, operate machinery or power tools, or sign legal documents for 24 hours. Do not drink alcohol. Do not take sleeping pills or other medications that may interfere with treatment.  Continue all activities unless the activities cause more pain. When the pain lessens, slowly resume normal activities. Gradually increase the intensity and duration of the activities or exercise.  During periods of severe pain, bed rest may be helpful. Lay or sit in any position that is comfortable.  Putting ice on the injured area.  Put ice in a bag.  Place a towel between your skin and the bag.  Leave the ice on for 15 to 20 minutes, 3 to 4 times a day.  Follow up with your caregiver for continued problems and no reason can be found for the pain. If the pain becomes worse or does not go away, it may be necessary to repeat tests or do additional testing. Your caregiver may need to look further for a possible cause. SEEK IMMEDIATE MEDICAL CARE IF:  You have pain that is getting worse and is not relieved by medications.  You develop chest pain  that is associated with shortness or breath, sweating, feeling sick to your stomach (nauseous), or throw up (vomit).  Your pain becomes localized to the abdomen.  You develop any new symptoms that seem different or that concern you. MAKE SURE YOU:   Understand these instructions.  Will watch your condition.  Will get help right away if you are not doing well or get worse. Document Released: 01/17/2005 Document Revised: 04/11/2011 Document Reviewed: 09/07/2007 [facility] Patient Information 2014 [place], [place]. Back Pain in Pregnancy Back pain during pregnancy is common. It happens in about half of all pregnancies. It is important for you and your baby that you remain active during your pregnancy.If you feel that back pain is not allowing you to remain active or sleep well, it is time to see your caregiver. Back pain may be caused by several factors related to changes during your pregnancy.Fortunately, unless you had trouble with your back before your pregnancy, the pain is likely to get better after you deliver. Low back pain usually occurs between the fifth and seventh months of pregnancy. It can, however, happen in the first couple months. Factors that increase the risk of back problems include:   Previous back problems.  Injury to your back.  Having twins or multiple births.  A chronic cough.  Stress.  Job-related repetitive motions.  Muscle or spinal disease in the back.  Family history of back problems, ruptured (herniated) discs, or osteoporosis.  Depression, anxiety, and panic attacks. CAUSES  When you are pregnant, your body produces a hormone called relaxin. This hormonemakes the ligaments connecting the low back and pubic bones more flexible. This flexibility allows the baby to be delivered more easily. When your ligaments are loose, your muscles need to work harder to support your back. Soreness in your back can come from tired muscles. Soreness can also come from  back tissues that are irritated since they are receiving less support.  As the baby grows, it puts pressure on the nerves and blood vessels in your pelvis. This can cause back pain.  As the baby grows and gets heavier during pregnancy, the uterus pushes the stomach muscles forward and changes your center of gravity. This makes your back muscles work harder to maintain good posture. SYMPTOMS  Lumbar pain during pregnancy Lumbar pain during pregnancy usually occurs at or above the waist in the center of the back. There may be pain and numbness that radiates into your leg or foot. This is similar to low back pain experienced by non-pregnant women. It usually increases with sitting for long periods of time, standing, or repetitive lifting. Tenderness may also be present in the muscles along your upper back. Posterior pelvic pain during pregnancy Pain in the back of the pelvis is more common than lumbar pain in pregnancy. It is a deep pain felt in your side at the waistline, or across the tailbone (sacrum), or in both places. You may have pain on one or both sides. This pain can also go into the buttocks and backs of the upper thighs. Pubic and groin pain may also be present. The pain does not quickly resolve with rest, and morning stiffness may also be present. Pelvic pain during pregnancy can be brought on by most activities. A high level of fitness before and during pregnancy may or may not prevent this problem. Labor pain is usually 1 to 2 minutes apart, lasts for about 1 minute, and involves a bearing down feeling or pressure in your pelvis. However, if you are at term with the pregnancy, constant low back pain can be the beginning of early labor, and you should be aware of this. DIAGNOSIS  X-rays of the back should not be done during the first 12 to 14 weeks of the pregnancy and only when absolutely necessary during the rest of the pregnancy. MRIs do not give off radiation and are safe during pregnancy.  MRIs also should only be done when absolutely necessary. HOME CARE INSTRUCTIONS  Exercise as directed by your caregiver. Exercise is the most effective way to prevent or manage back pain. If you have a back problem, it is especially important to avoid sports that require sudden body movements. Swimming and walking are great activities.  Do not stand in one place for long periods of time.  Do not wear high heels.  Sit in chairs with good posture. Use a pillow on your lower back if necessary. Make sure your head rests over your shoulders and is not hanging forward.  Try sleeping on your side, preferably the left side, with a pillow or two between your legs. If you are sore after a night's rest, your bedmay betoo soft.Try placing a board between your mattress and box spring.  Listen to your body when lifting.If you are experiencing pain, ask for help or try bending yourknees more so you can use your leg muscles rather than your back muscles. Squat down when picking up something from the floor. Do not bend over.  Eat  a healthy diet. Try to gain weight within your caregiver's recommendations.  Use heat or cold packs 3 to 4 times a day for 15 minutes to help with the pain.  Only take over-the-counter or prescription medicines for pain, discomfort, or fever as directed by your caregiver. Sudden (acute) back pain  Use bed rest for only the most extreme, acute episodes of back pain. Prolonged bed rest over 48 hours will aggravate your condition.  Ice is very effective for acute conditions.  Put ice in a plastic bag.  Place a towel between your skin and the bag.  Leave the ice on for 10 to 20 minutes every 2 hours, or as needed.  Using heat packs for 30 minutes prior to activities is also helpful. Continued back pain See your caregiver if you have continued problems. Your caregiver can help or refer you for appropriate physical therapy. With conditioning, most back problems can be  avoided. Sometimes, a more serious issue may be the cause of back pain. You should be seen right away if new problems seem to be developing. Your caregiver may recommend:  A maternity girdle.  An elastic sling.  A back brace.  A massage therapist or acupuncture. SEEK MEDICAL CARE IF:   You are not able to do most of your daily activities, even when taking the pain medicine you were given.  You need a referral to a physical therapist or chiropractor.  You want to try acupuncture. SEEK IMMEDIATE MEDICAL CARE IF:  You develop numbness, tingling, weakness, or problems with the use of your arms or legs.  You develop severe back pain that is no longer relieved with medicines.  You have a sudden change in bowel or bladder control.  You have increasing pain in other areas of the body.  You develop shortness of breath, dizziness, or fainting.  You develop nausea, vomiting, or sweating.  You have back pain which is similar to labor pains.  You have back pain along with your water breaking or vaginal bleeding.  You have back pain or numbness that travels down your leg.  Your back pain developed after you fell.  You develop pain on one side of your back. You may have a kidney stone.  You see blood in your urine. You may have a bladder infection or kidney stone.  You have back pain with blisters. You may have shingles. Back pain is fairly common during pregnancy but should not be accepted as just part of the process. Back pain should always be treated as soon as possible. This will make your pregnancy as pleasant as possible. Document Released: 04/27/2005 Document Revised: 04/11/2011 Document Reviewed: 06/08/2010 [facility] Patient Information 2014 [place], [place].

## 2012-10-16 ENCOUNTER — Other Ambulatory Visit: Payer: Medicaid Other

## 2012-10-26 ENCOUNTER — Telehealth: Payer: Self-pay | Admitting: Obstetrics & Gynecology

## 2012-10-26 ENCOUNTER — Other Ambulatory Visit: Payer: Medicaid Other

## 2012-10-26 DIAGNOSIS — Z3483 Encounter for supervision of other normal pregnancy, third trimester: Secondary | ICD-10-CM

## 2012-10-26 MED ORDER — CLINDAMYCIN HCL 300 MG PO CAPS: 300.0000 mg | ORAL_CAPSULE | Freq: Three times a day (TID) | ORAL | Status: DC

## 2012-10-26 NOTE — Telephone Encounter (Signed)
Left message letting pt know Cleocin has been e prescribed for topical lesions to [facility]. JSY

## 2012-10-26 NOTE — Telephone Encounter (Signed)
Pt with several skin lesions consistent with staph, cleocin 300 tid e prescribed

## 2012-10-27 LAB — CBC
MCH: 31.1 pg (ref 26.0–34.0)
MCHC: 34.2 g/dL (ref 30.0–36.0)
MCV: 91 fL (ref 78.0–100.0)
Platelets: 133 10*3/uL — ABNORMAL LOW (ref 150–400)
RDW: 13.7 % (ref 11.5–15.5)
WBC: 8.2 10*3/uL (ref 4.0–10.5)

## 2012-10-27 LAB — GLUCOSE TOLERANCE, 2 HOURS W/ 1HR: Glucose, Fasting: 65 mg/dL — ABNORMAL LOW (ref 70–99)

## 2012-10-27 LAB — RPR

## 2012-10-29 LAB — HSV 2 ANTIBODY, IGG: HSV 2 Glycoprotein G Ab, IgG: <0.1 IV

## 2012-11-05 ENCOUNTER — Other Ambulatory Visit: Payer: Self-pay | Admitting: Obstetrics and Gynecology

## 2012-11-05 ENCOUNTER — Ambulatory Visit (INDEPENDENT_AMBULATORY_CARE_PROVIDER_SITE_OTHER): Payer: Medicaid Other | Admitting: Obstetrics and Gynecology

## 2012-11-05 VITALS — BP 120/78 | Wt 200.6 lb

## 2012-11-05 DIAGNOSIS — F1920 Other psychoactive substance dependence, uncomplicated: Secondary | ICD-10-CM

## 2012-11-05 DIAGNOSIS — Z331 Pregnant state, incidental: Secondary | ICD-10-CM

## 2012-11-05 DIAGNOSIS — O99330 Smoking (tobacco) complicating pregnancy, unspecified trimester: Secondary | ICD-10-CM

## 2012-11-05 DIAGNOSIS — O99340 Other mental disorders complicating pregnancy, unspecified trimester: Secondary | ICD-10-CM

## 2012-11-05 DIAGNOSIS — O0930 Supervision of pregnancy with insufficient antenatal care, unspecified trimester: Secondary | ICD-10-CM

## 2012-11-05 DIAGNOSIS — Z23 Encounter for immunization: Secondary | ICD-10-CM

## 2012-11-05 DIAGNOSIS — Z1389 Encounter for screening for other disorder: Secondary | ICD-10-CM

## 2012-11-05 DIAGNOSIS — Z3480 Encounter for supervision of other normal pregnancy, unspecified trimester: Secondary | ICD-10-CM

## 2012-11-05 MED ORDER — INFLUENZA VAC SPLIT QUAD 0.5 ML IM SUSP
0.5000 mL | Freq: Once | INTRAMUSCULAR | Status: AC
  Administered 2012-11-05: 0.5 mL via INTRAMUSCULAR

## 2012-11-05 NOTE — Patient Instructions (Signed)
Fetal Movement Counts Patient Name: __________________________________________________ Patient Due Date: ____________________ Performing a fetal movement count is highly recommended in high-risk pregnancies, but it is good for every pregnant woman to do. Your caregiver may ask you to start counting fetal movements at 28 weeks of the pregnancy. Fetal movements often increase:  After eating a full meal.  After physical activity.  After eating or drinking something sweet or cold.  At rest. Pay attention to when you feel the baby is most active. This will help you notice a pattern of your baby's sleep and wake cycles and what factors contribute to an increase in fetal movement. It is important to perform a fetal movement count at the same time each day when your baby is normally most active.  HOW TO COUNT FETAL MOVEMENTS 1. Find a quiet and comfortable area to sit or lie down on your left side. Lying on your left side provides the best blood and oxygen circulation to your baby. 2. Write down the day and time on a sheet of paper or in a journal. 3. Start counting kicks, flutters, swishes, rolls, or jabs in a 2 hour period. You should feel at least 10 movements within 2 hours. 4. If you do not feel 10 movements in 2 hours, wait 2 3 hours and count again. Look for a change in the pattern or not enough counts in 2 hours. SEEK MEDICAL CARE IF:  You feel less than 10 counts in 2 hours, tried twice.  There is no movement in over an hour.  The pattern is changing or taking longer each day to reach 10 counts in 2 hours.  You feel the baby is not moving as he or she usually does. Date: ____________ Movements: ____________ Start time: ____________ Finish time: ____________  Date: ____________ Movements: ____________ Start time: ____________ Finish time: ____________ Date: ____________ Movements: ____________ Start time: ____________ Finish time: ____________ Date: ____________ Movements: ____________  Start time: ____________ Finish time: ____________ Date: ____________ Movements: ____________ Start time: ____________ Finish time: ____________ Date: ____________ Movements: ____________ Start time: ____________ Finish time: ____________ Date: ____________ Movements: ____________ Start time: ____________ Finish time: ____________ Date: ____________ Movements: ____________ Start time: ____________ Finish time: ____________  Date: ____________ Movements: ____________ Start time: ____________ Finish time: ____________ Date: ____________ Movements: ____________ Start time: ____________ Finish time: ____________ Date: ____________ Movements: ____________ Start time: ____________ Finish time: ____________ Date: ____________ Movements: ____________ Start time: ____________ Finish time: ____________ Date: ____________ Movements: ____________ Start time: ____________ Finish time: ____________ Date: ____________ Movements: ____________ Start time: ____________ Finish time: ____________ Date: ____________ Movements: ____________ Start time: ____________ Finish time: ____________  Date: ____________ Movements: ____________ Start time: ____________ Finish time: ____________ Date: ____________ Movements: ____________ Start time: ____________ Finish time: ____________ Date: ____________ Movements: ____________ Start time: ____________ Finish time: ____________ Date: ____________ Movements: ____________ Start time: ____________ Finish time: ____________ Date: ____________ Movements: ____________ Start time: ____________ Finish time: ____________ Date: ____________ Movements: ____________ Start time: ____________ Finish time: ____________ Date: ____________ Movements: ____________ Start time: ____________ Finish time: ____________  Date: ____________ Movements: ____________ Start time: ____________ Finish time: ____________ Date: ____________ Movements: ____________ Start time: ____________ Finish time:  ____________ Date: ____________ Movements: ____________ Start time: ____________ Finish time: ____________ Date: ____________ Movements: ____________ Start time: ____________ Finish time: ____________ Date: ____________ Movements: ____________ Start time: ____________ Finish time: ____________ Date: ____________ Movements: ____________ Start time: ____________ Finish time: ____________ Date: ____________ Movements: ____________ Start time: ____________ Finish time: ____________  Date: ____________ Movements: ____________ Start time: ____________ Finish   time: ____________ Date: ____________ Movements: ____________ Start time: ____________ Finish time: ____________ Date: ____________ Movements: ____________ Start time: ____________ Finish time: ____________ Date: ____________ Movements: ____________ Start time: ____________ Finish time: ____________ Date: ____________ Movements: ____________ Start time: ____________ Finish time: ____________ Date: ____________ Movements: ____________ Start time: ____________ Finish time: ____________ Date: ____________ Movements: ____________ Start time: ____________ Finish time: ____________  Date: ____________ Movements: ____________ Start time: ____________ Finish time: ____________ Date: ____________ Movements: ____________ Start time: ____________ Finish time: ____________ Date: ____________ Movements: ____________ Start time: ____________ Finish time: ____________ Date: ____________ Movements: ____________ Start time: ____________ Finish time: ____________ Date: ____________ Movements: ____________ Start time: ____________ Finish time: ____________ Date: ____________ Movements: ____________ Start time: ____________ Finish time: ____________ Date: ____________ Movements: ____________ Start time: ____________ Finish time: ____________  Date: ____________ Movements: ____________ Start time: ____________ Finish time: ____________ Date: ____________ Movements:  ____________ Start time: ____________ Finish time: ____________ Date: ____________ Movements: ____________ Start time: ____________ Finish time: ____________ Date: ____________ Movements: ____________ Start time: ____________ Finish time: ____________ Date: ____________ Movements: ____________ Start time: ____________ Finish time: ____________ Date: ____________ Movements: ____________ Start time: ____________ Finish time: ____________ Date: ____________ Movements: ____________ Start time: ____________ Finish time: ____________  Date: ____________ Movements: ____________ Start time: ____________ Finish time: ____________ Date: ____________ Movements: ____________ Start time: ____________ Finish time: ____________ Date: ____________ Movements: ____________ Start time: ____________ Finish time: ____________ Date: ____________ Movements: ____________ Start time: ____________ Finish time: ____________ Date: ____________ Movements: ____________ Start time: ____________ Finish time: ____________ Date: ____________ Movements: ____________ Start time: ____________ Finish time: ____________ Document Released: 02/16/2006 Document Revised: 01/04/2012 Document Reviewed: 11/14/2011 [employer] Patient Information 2014 [employer].  

## 2012-11-05 NOTE — Progress Notes (Signed)
C/o "knots on left inner thigh, pain right inner thigh" GBS, GC/CHL, UDS today. Pt received flu vaccine today.

## 2012-11-05 NOTE — Progress Notes (Signed)
Low pain tolerance of pregnancy discomforts. Acting as if the least movements on exam table require assistance from her FOB's mom, unwilling to try unassisted. Pt and FOB's mom concerned over "knots' in inner thigh, with PEx showing no evidence of any abnormalities.   Pt's first baby currently in custody of a paternal uncle and aunt ,"taken by DSS" due to + UDS , alleged as +THC. Pt agrees to UDS today to document alleged negative status. FOB's mom intervenes in efforts to ask normal questions to pt., answers for her,  Kick counts reviewed. Cervix check, cx multiparous, posterior, well applied.   P UDS today, given kick ct info, rechk q wk or prn concerns.

## 2012-11-06 ENCOUNTER — Telehealth: Payer: Self-pay | Admitting: *Deleted

## 2012-11-06 LAB — DRUG SCREEN, URINE, NO CONFIRMATION
Amphetamine Screen, Ur: NEGATIVE
Barbiturate Quant, Ur: NEGATIVE
Benzodiazepines.: POSITIVE — AB
Cocaine Metabolites: NEGATIVE
Creatinine,U: 77.4 mg/dL
Marijuana Metabolite: NEGATIVE
Methadone: NEGATIVE
Opiate Screen, Urine: NEGATIVE
Phencyclidine (PCP): NEGATIVE
Propoxyphene: NEGATIVE

## 2012-11-06 LAB — POCT URINALYSIS DIPSTICK
Glucose, UA: NEGATIVE
Nitrite, UA: NEGATIVE

## 2012-11-06 LAB — GC/CHLAMYDIA PROBE AMP
CT Probe RNA: NEGATIVE
GC Probe RNA: NEGATIVE

## 2012-11-06 LAB — OXYCODONE SCREEN, UA, RFLX CONFIRM

## 2012-11-06 NOTE — Telephone Encounter (Signed)
Misty Reynolds, Child psychotherapist from child protective agency of [facility], requesting date of last appt, UDS results, and pt next appt date. All listed  information given.

## 2012-11-07 LAB — STREP B DNA PROBE: GBSP: NEGATIVE

## 2012-11-08 LAB — OPIATES/OPIOIDS (LC/MS-MS)
Codeine Urine: NEGATIVE ng/mL
Heroin (6-AM), UR: NEGATIVE ng/mL
Hydrocodone: 467 ng/mL
Noroxycodone, Ur: 56 ng/mL

## 2012-11-12 ENCOUNTER — Telehealth: Payer: Self-pay | Admitting: Obstetrics and Gynecology

## 2012-11-12 ENCOUNTER — Encounter: Payer: Medicaid Other | Admitting: Obstetrics and Gynecology

## 2012-11-12 NOTE — Telephone Encounter (Signed)
Pt states having numbness and pain in wrist and hand since taking clindamycin. Per Cyril Mourning, NP not sure related to abx can make an appt to be evaluated. Pt states not able to come in for an appt at this time has an appt 11/20/2012.

## 2012-11-13 ENCOUNTER — Inpatient Hospital Stay (HOSPITAL_COMMUNITY)
Admission: AD | Admit: 2012-11-13 | Discharge: 2012-11-15 | DRG: 775 | Disposition: A | Discharge status: Home / Self Care | Payer: Medicaid Other | Source: Ambulatory Visit | Attending: Obstetrics & Gynecology | Admitting: Obstetrics & Gynecology

## 2012-11-13 ENCOUNTER — Encounter (HOSPITAL_COMMUNITY): Payer: Self-pay | Admitting: *Deleted

## 2012-11-13 DIAGNOSIS — Z3482 Encounter for supervision of other normal pregnancy, second trimester: Secondary | ICD-10-CM

## 2012-11-13 DIAGNOSIS — O99344 Other mental disorders complicating childbirth: Secondary | ICD-10-CM

## 2012-11-13 DIAGNOSIS — O0932 Supervision of pregnancy with insufficient antenatal care, second trimester: Secondary | ICD-10-CM

## 2012-11-13 DIAGNOSIS — F411 Generalized anxiety disorder: Secondary | ICD-10-CM

## 2012-11-13 DIAGNOSIS — O99334 Smoking (tobacco) complicating childbirth: Secondary | ICD-10-CM | POA: Diagnosis present

## 2012-11-13 HISTORY — DX: Methicillin resistant Staphylococcus aureus infection, unspecified site: A49.02

## 2012-11-13 LAB — RAPID URINE DRUG SCREEN, HOSP PERFORMED
Amphetamines: NOT DETECTED
Barbiturates: NOT DETECTED
Benzodiazepines: POSITIVE — AB
Tetrahydrocannabinol: NOT DETECTED

## 2012-11-13 LAB — OB RESULTS CONSOLE GC/CHLAMYDIA
Chlamydia: NEGATIVE
Gonorrhea: NEGATIVE

## 2012-11-13 LAB — MRSA PCR SCREENING: MRSA by PCR: NEGATIVE

## 2012-11-13 LAB — RPR: RPR Ser Ql: NONREACTIVE

## 2012-11-13 MED ORDER — LANOLIN HYDROUS EX OINT: TOPICAL_OINTMENT | CUTANEOUS | Status: DC | PRN

## 2012-11-13 MED ORDER — FLEET ENEMA 7-19 GM/118ML RE ENEM: 1.0000 | ENEMA | RECTAL | Status: DC | PRN

## 2012-11-13 MED ORDER — FENTANYL CITRATE 0.05 MG/ML IJ SOLN
INTRAMUSCULAR | Status: AC
  Administered 2012-11-13: 100 ug via INTRAVENOUS
  Filled 2012-11-13: qty 2

## 2012-11-13 MED ORDER — ACETAMINOPHEN 325 MG PO TABS: 650.0000 mg | ORAL_TABLET | ORAL | Status: DC | PRN

## 2012-11-13 MED ORDER — DIBUCAINE 1 % RE OINT: 1.0000 "application " | TOPICAL_OINTMENT | RECTAL | Status: DC | PRN

## 2012-11-13 MED ORDER — OXYTOCIN 40 UNITS IN LACTATED RINGERS INFUSION - SIMPLE MED
62.5000 mL/h | INTRAVENOUS | Status: DC
  Administered 2012-11-13: 62.5 mL/h via INTRAVENOUS
  Filled 2012-11-13: qty 1000

## 2012-11-13 MED ORDER — SENNOSIDES-DOCUSATE SODIUM 8.6-50 MG PO TABS
2.0000 | ORAL_TABLET | ORAL | Status: DC
  Filled 2012-11-13: qty 2

## 2012-11-13 MED ORDER — LIDOCAINE HCL (PF) 1 % IJ SOLN
30.0000 mL | INTRAMUSCULAR | Status: DC | PRN
  Filled 2012-11-13 (×2): qty 30

## 2012-11-13 MED ORDER — LACTATED RINGERS IV SOLN: 500.0000 mL | INTRAVENOUS | Status: DC | PRN

## 2012-11-13 MED ORDER — BENZOCAINE-MENTHOL 20-0.5 % EX AERO: 1.0000 "application " | INHALATION_SPRAY | CUTANEOUS | Status: DC | PRN

## 2012-11-13 MED ORDER — DIPHENHYDRAMINE HCL 25 MG PO CAPS: 25.0000 mg | ORAL_CAPSULE | Freq: Four times a day (QID) | ORAL | Status: DC | PRN

## 2012-11-13 MED ORDER — CITRIC ACID-SODIUM CITRATE 334-500 MG/5ML PO SOLN: 30.0000 mL | ORAL | Status: DC | PRN

## 2012-11-13 MED ORDER — WITCH HAZEL-GLYCERIN EX PADS: 1.0000 "application " | MEDICATED_PAD | CUTANEOUS | Status: DC | PRN

## 2012-11-13 MED ORDER — ONDANSETRON HCL 4 MG/2ML IJ SOLN: 4.0000 mg | Freq: Four times a day (QID) | INTRAMUSCULAR | Status: DC | PRN

## 2012-11-13 MED ORDER — LACTATED RINGERS IV SOLN
INTRAVENOUS | Status: DC
  Administered 2012-11-13: 18:00:00 via INTRAVENOUS

## 2012-11-13 MED ORDER — FENTANYL CITRATE 0.05 MG/ML IJ SOLN
100.0000 ug | Freq: Once | INTRAMUSCULAR | Status: DC
  Administered 2012-11-13: 100 ug via INTRAVENOUS

## 2012-11-13 MED ORDER — NALOXONE HCL 0.4 MG/ML IJ SOLN
INTRAMUSCULAR | Status: AC
  Filled 2012-11-13: qty 1

## 2012-11-13 MED ORDER — TETANUS-DIPHTH-ACELL PERTUSSIS 5-2.5-18.5 LF-MCG/0.5 IM SUSP
0.5000 mL | Freq: Once | INTRAMUSCULAR | Status: AC
  Administered 2012-11-14: 0.5 mL via INTRAMUSCULAR

## 2012-11-13 MED ORDER — ONDANSETRON HCL 4 MG/2ML IJ SOLN
4.0000 mg | INTRAMUSCULAR | Status: DC | PRN
  Filled 2012-11-13: qty 2

## 2012-11-13 MED ORDER — PRENATAL MULTIVITAMIN CH
1.0000 | ORAL_TABLET | Freq: Every day | ORAL | Status: DC
  Administered 2012-11-14: 1 via ORAL
  Filled 2012-11-13: qty 1

## 2012-11-13 MED ORDER — ALPRAZOLAM 0.5 MG PO TABS
0.5000 mg | ORAL_TABLET | Freq: Three times a day (TID) | ORAL | Status: DC | PRN
  Administered 2012-11-13: 0.5 mg via ORAL
  Filled 2012-11-13: qty 1

## 2012-11-13 MED ORDER — IBUPROFEN 600 MG PO TABS
600.0000 mg | ORAL_TABLET | Freq: Four times a day (QID) | ORAL | Status: DC
  Administered 2012-11-13 – 2012-11-15 (×5): 600 mg via ORAL
  Filled 2012-11-13 (×5): qty 1

## 2012-11-13 MED ORDER — OXYTOCIN BOLUS FROM INFUSION
500.0000 mL | INTRAVENOUS | Status: DC
  Administered 2012-11-13: 500 mL via INTRAVENOUS

## 2012-11-13 MED ORDER — IBUPROFEN 600 MG PO TABS: 600.0000 mg | ORAL_TABLET | Freq: Four times a day (QID) | ORAL | Status: DC | PRN

## 2012-11-13 MED ORDER — ONDANSETRON HCL 4 MG PO TABS: 4.0000 mg | ORAL_TABLET | ORAL | Status: DC | PRN

## 2012-11-13 MED ORDER — ZOLPIDEM TARTRATE 5 MG PO TABS: 5.0000 mg | ORAL_TABLET | Freq: Every evening | ORAL | Status: DC | PRN

## 2012-11-13 MED ORDER — SIMETHICONE 80 MG PO CHEW: 80.0000 mg | CHEWABLE_TABLET | ORAL | Status: DC | PRN

## 2012-11-13 MED ORDER — FENTANYL CITRATE 0.05 MG/ML IJ SOLN: 100.0000 ug | INTRAMUSCULAR | Status: DC | PRN

## 2012-11-13 MED ORDER — OXYCODONE-ACETAMINOPHEN 5-325 MG PO TABS
1.0000 | ORAL_TABLET | ORAL | Status: DC | PRN
  Administered 2012-11-13: 1 via ORAL
  Administered 2012-11-14 – 2012-11-15 (×4): 2 via ORAL
  Administered 2012-11-15: 1 via ORAL
  Filled 2012-11-13 (×4): qty 2
  Filled 2012-11-13 (×2): qty 1

## 2012-11-13 MED ORDER — OXYCODONE-ACETAMINOPHEN 5-325 MG PO TABS
1.0000 | ORAL_TABLET | ORAL | Status: DC | PRN
  Administered 2012-11-13: 1 via ORAL
  Filled 2012-11-13: qty 1

## 2012-11-13 NOTE — MAU Provider Note (Deleted)
History     CSN: [id number]  Arrival date and time: 11/13/12 4098   None     Chief Complaint  Patient presents with  . Labor Eval   HPI [name] is a 27 y.o G2P1001 at 39w1 who presents for abdominal pain, mucousy vaginal discharge. Per pt passed mucus plug last night. Noted this morning to have continued vaginal discharge that was pinkish in coloration. No foul odor or pruritis. Pt also attesting to intense abdominal discomfort that began this am, occuring every 7 minutes lasting 30-40sec in duration. Still with active fetal movement.   Per Pt has gotten PNC at [facility] since 2nd trimester. There is a significant concern for substance abuse. Pt on xanax for anxiety, has been counseled about weaning down during pregnancy. Per pt takes 0.5mg  QID, although clinic documentation suggests pt only supposed to take BID. UDS on 10/6 positive for hydrocodone, norhydrocodone, hydromorphone, oxymorphone. There is also a CPS case spending for placement of this child. Otherwise genetic screen nml, glucose tolerance nml. Pt plans to bottle feed and use nexplanon for contraception. 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 Was given abx prior to pregnancy.  OB History   Grav Para Term Preterm Abortions TAB SAB Ect Mult Living   2 1 1       1       Past Medical History  Diagnosis Date  . Anxiety   . Staph infection     left breast    Past Surgical History  Procedure Laterality Date  . No past surgeries      History reviewed. No pertinent family history.  History  Substance Use Topics  . Smoking status: Current Every Day Smoker -- 0.50 packs/day    Types: Cigarettes  . Smokeless tobacco: Never Used  . Alcohol Use: No    Allergies: No Known Allergies  Prescriptions prior to admission  Medication Sig Dispense Refill  . ALPRAZolam (XANAX) 0.5 MG tablet Take 0.5 mg by mouth 3 (three) times daily.      . clindamycin (CLEOCIN) 300 MG capsule Take 1 capsule (300 mg  total) by mouth 3 (three) times daily.  30 capsule  0  . cyclobenzaprine (FLEXERIL) 10 MG tablet Take 1 tablet (10 mg total) by mouth 3 (three) times daily as needed for muscle spasms.  30 tablet  0  . HYDROcodone-homatropine (HYCODAN) 5-1.5 MG/5ML syrup Take 2.5 mLs by mouth at bedtime as needed for cough.  120 mL  0  . prenatal vitamin w/FE, FA (PRENATAL 1 + 1) 27-1 MG TABS Take 1 tablet by mouth daily.  30 each  11    Review of Systems  Constitutional: Positive for malaise/fatigue. Negative for fever.  HENT: Negative for congestion and sore throat.   Eyes: Negative for blurred vision and pain.  Respiratory: Negative for cough, shortness of breath and wheezing.   Cardiovascular: Negative for chest pain and palpitations.  Gastrointestinal: Negative for heartburn, nausea, vomiting and abdominal pain.  Genitourinary: Negative for dysuria, urgency and frequency.  Musculoskeletal: Negative for myalgias and neck pain.  Skin: Negative for rash.  Neurological: Negative for dizziness, seizures, loss of consciousness, weakness and headaches.  Endo/Heme/Allergies: Does not bruise/bleed easily.  Psychiatric/Behavioral: Positive for substance abuse. The patient is nervous/anxious.    Physical Exam   Blood pressure 134/93, pulse 81, temperature 98 F (36.7 C), temperature source Oral, resp. rate 18, last menstrual period 01/15/2012.  Physical Exam  Constitutional: She is oriented to person, place,  and time. She appears well-developed and well-nourished. No distress.  HENT:  Head: Normocephalic and atraumatic.  Eyes: EOM are normal.  Neck: Neck supple.  Cardiovascular: Normal rate, regular rhythm and normal heart sounds.   Respiratory: Effort normal and breath sounds normal. No respiratory distress.  GI: Soft. Bowel sounds are normal. She exhibits no distension.  Musculoskeletal: Normal range of motion. She exhibits no edema.  Neurological: She is alert and oriented to person, place, and time.   Skin: Skin is warm and dry.  Psychiatric: She has a normal mood and affect.    MAU Course  Procedures  MDM UDS RPR CBC  Assessment and Plan  Misty Reynolds is a 27 y.o G2P1001 who presents in active labor at term Patient admitted to L&D, see L&D note for H&P  Anselm Lis 11/13/2012, 4:52 PM

## 2012-11-13 NOTE — H&P (Cosign Needed)
[name] is a 27 y.o G2P1001 at 39w1 who presents for abdominal pain, mucousy vaginal discharge. Per pt passed mucus plug last night. Noted this morning to have continued vaginal discharge that was pinkish in coloration. No foul odor or pruritis. Pt also attesting to intense abdominal discomfort that began this am, occuring every 7 minutes lasting 30-40sec in duration. Still with active fetal movement.   Per Pt has gotten [facility] at [facility] since 2nd trimester. There is a significant concern for substance abuse. Pt on xanax for anxiety, has been counseled about weaning down during pregnancy. Per pt takes 0.5mg  QID, although clinic documentation suggests pt only supposed to take BID. UDS on 10/6 positive for hydrocodone, norhydrocodone, hydromorphone, oxymorphone. There is also a CPS case spending for placement of this child. Otherwise genetic screen nml, glucose tolerance nml. Pt plans to bottle feed and use nexplanon for contraception. 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 Was given abx prior to pregnancy   Prenatal History/Complications:  Past Medical History: Past Medical History  Diagnosis Date  . Anxiety   . Staph infection     left breast    Past Surgical History: Past Surgical History  Procedure Laterality Date  . No past surgeries      Obstetrical History: OB History   Grav Para Term Preterm Abortions TAB SAB Ect Mult Living   2 1 1       1      Social History: History   Social History  . Marital Status: Single    Spouse Name: N/A    Number of Children: N/A  . Years of Education: N/A   Social History Main Topics  . Smoking status: Current Every Day Smoker -- 0.50 packs/day    Types: Cigarettes  . Smokeless tobacco: Never Used  . Alcohol Use: No  . Drug Use: Yes    Special: Oxycodone     Comment: Urine pos for oxycodone in October.  . Sexual Activity: Yes    Birth Control/ Protection: None   Other Topics Concern  . None   Social History  Narrative  . None    Family History: History reviewed. No pertinent family history.  Allergies: No Known Allergies  Prescriptions prior to admission  Medication Sig Dispense Refill  . ALPRAZolam (XANAX) 0.5 MG tablet Take 0.5 mg by mouth 3 (three) times daily.      . clindamycin (CLEOCIN) 300 MG capsule Take 1 capsule (300 mg total) by mouth 3 (three) times daily.  30 capsule  0  . cyclobenzaprine (FLEXERIL) 10 MG tablet Take 1 tablet (10 mg total) by mouth 3 (three) times daily as needed for muscle spasms.  30 tablet  0  . HYDROcodone-homatropine (HYCODAN) 5-1.5 MG/5ML syrup Take 2.5 mLs by mouth at bedtime as needed for cough.  120 mL  0  . prenatal vitamin w/FE, FA (PRENATAL 1 + 1) 27-1 MG TABS Take 1 tablet by mouth daily.  30 each  11     Review of Systems   Constitutional: Negative for fever, chills, weight loss, malaise/fatigue and diaphoresis.  HENT: Negative for hearing loss, ear pain, nosebleeds, congestion, sore throat, neck pain, tinnitus and ear discharge.   Eyes: Negative for blurred vision, double vision, photophobia, pain, discharge and redness.  Respiratory: Negative for cough, hemoptysis, sputum production, shortness of breath, wheezing and stridor.   Cardiovascular: Negative for chest pain, palpitations, orthopnea,  leg swelling  Gastrointestinal: Positive for abdominal pain. Negative for heartburn, nausea,  vomiting, diarrhea, constipation, blood in stool Genitourinary: Negative for dysuria, urgency, frequency, hematuria and flank pain.  Musculoskeletal: Negative for myalgias, back pain, joint pain and falls.  Skin: Negative for itching and rash.  Neurological: Negative for dizziness, tingling, tremors, sensory change, speech change, focal weakness, seizures, loss of consciousness, weakness and headaches.  Endo/Heme/Allergies: Negative for environmental allergies and polydipsia. Does not bruise/bleed easily.  Psychiatric/Behavioral: Negative for depression,  suicidal ideas, hallucinations, memory loss and substance abuse. The patient is not nervous/anxious and does not have insomnia.       Blood pressure 110/65, pulse 73, temperature 98 F (36.7 C), temperature source Oral, resp. rate 18, last menstrual period 01/15/2012. General appearance: alert, cooperative and no distress Lungs: clear to auscultation bilaterally Breast: left nipple expressing serous discharge Heart: regular rate and rhythm Abdomen: soft, non-tender; bowel sounds normal Pelvic: 5,80, -2 per Misty Reynolds Extremities: Homans sign is negative, no sign of DVT DTR's nml Presentation: cephalic Fetal monitoringBaseline: 140 bpm, Variability: Good {> 6 bpm), Accelerations: Reactive and Decelerations: Absent Uterine activity every 1-3 mins for 60-70sec Dilation: 5 Effacement (%): 80 Station: -2 Exam by:: Misty German RN   Prenatal labs: ABO, Rh: O/POS/-- (06/17 1625) Antibody: NEG (09/26 1026) Rubella:   RPR: NON REAC (09/26 1026)  HBsAg: NEGATIVE (06/17 1625)  HIV: NON REACTIVE (09/26 1026)  GBS: NEGATIVE (10/06 1340)  1 hr Glucola nml Genetic screening  nml Anatomy US nml  Assessment: Misty Reynolds is a 27 y.o. G2P1001 with an IUP at [redacted]w[redacted]d presenting in active labor at term  Plan: 1. Active labor- making cervical change in the MAU, having adequate contraction pattern, pt visible uncomfortable -routine orders  -HIV NR -GBS neg -pain control with fentanyl and epidural after UDS is collected  2. Substance abuse- per documentation pt has hx of substance abuse throughout pregnancy. Does use xanax 0.5 QID but UDS post for opiates as well. During exam this pm pt had difficulty making eye contact. Mother in law upset about open CPS case and concerns about custody of child -UDS here, concern for neonatal withdrawal -plan for anticipatory care of neonate at delivery -will need SW consult  3. Current smoker- per pt 2-3 cig per day but expect it is more -encourage  cessation -will give information on quit line prior to d/c  4. Anxiety- pt on chronic benzos, was advised abt danger to fetus with chronic use -will monitor for signs/symptoms of withdrawal -would avoid oversedation during labor  5. FHR- tracing reassuring -cat I  6. Postpartum -pt desires to bottle feed -would like to use nexplanon for contraception -follow up in [facility] post-[facility]  Anselm Lis, MD 11/13/2012, 6:23 PM  I spoke with and examined patient and agree with resident's note and plan of care.  Tawana Scale, MD OB Fellow 11/13/2012 8:29 PM

## 2012-11-13 NOTE — MAU Note (Signed)
Contractions started today around 1200, has bloody show, no LOF.  Active FM.

## 2012-11-14 LAB — CBC
MCH: 31.4 pg (ref 26.0–34.0)
MCHC: 35.7 g/dL (ref 30.0–36.0)
MCV: 87.9 fL (ref 78.0–100.0)
Platelets: 111 10*3/uL — ABNORMAL LOW (ref 150–400)
RBC: 4.37 MIL/uL (ref 3.87–5.11)

## 2012-11-14 NOTE — Lactation Note (Signed)
This note was copied from the chart of Misty Reynolds. Lactation Consultation Note  Patient Name: Misty Reynolds Date: 11/14/2012 Reason for consult: Initial assessment   Maternal Data Formula Feeding for Exclusion: Yes Reason for exclusion: Mother's choice to forumla feed on admision  Feeding Feeding Type: Formula Nipple Type: Slow - flow Length of feed: 30 min  LATCH Score/Interventions                      Lactation Tools Discussed/Used     Consult Status Consult Status: Complete    Alfred Levins 11/14/2012, 7:06 PM

## 2012-11-14 NOTE — Progress Notes (Signed)
Ur chart review completed.  

## 2012-11-14 NOTE — Progress Notes (Signed)
Post Partum Day 1 Subjective: up ad lib, voiding, tolerating PO and + flatus. Patient complains of lower back ache today. Her pain is controlled with 2 percocet.   Objective: Blood pressure 108/66, pulse 81, temperature 98.5 F (36.9 C), temperature source Oral, resp. rate 18, height 5\' 5"  (1.651 m), weight 91.173 kg (201 lb), last menstrual period 01/15/2012, SpO2 95.00%, unknown if currently breastfeeding.  Physical Exam:  General: alert and cooperative Lochia: appropriate Uterine Fundus: firm Incision: N/A DVT Evaluation: No evidence of DVT seen on physical exam. Negative Homan's sign. No cords or calf tenderness.   Recent Labs  11/13/12 1800  HGB 13.7  HCT 38.4   Ms Luetta Piazza is a 27 y.o G2P1001 at 39w1 who presented for abdominal pain, mucousy vaginal discharge. Per Pt has gotten [facility] at [facility] since 2nd trimester. There is a significant concern for substance abuse. Pt on xanax for anxiety, has been counseled about weaning down during pregnancy. Per pt takes 0.5mg  QID, although clinic documentation suggests pt only supposed to take BID. UDS on 10/6 positive for hydrocodone, norhydrocodone, hydromorphone, oxymorphone. There is also a CPS case spending for placement of this child.  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 Baby with signs of withdraw.    LOS: 1 day   Felix Pacini 11/14/2012, 6:21 AM   I have seen and examined this patient and agree with above documentation in the resident's note. Awaiting social work consult for dispo planning purposes.   Rulon Abide, M.D. [facility] Fellow 11/14/2012 9:47 AM

## 2012-11-15 MED ORDER — IBUPROFEN 600 MG PO TABS: 600.0000 mg | ORAL_TABLET | Freq: Four times a day (QID) | ORAL | Status: DC

## 2012-11-15 NOTE — Progress Notes (Signed)
CSW notified security, AD, and bedside RN of CPS workers upcoming visit today.  CSW is concerned about how FOB will behave.  CSW met with parents along side of CPS workers J Houchins and M. McClary.  FOB became heated numerous times, but did not have an "episode" like yesterday.  MOB was tearful, but calm.  When meeting was over, FOB said we needed to "get them the hell out of here before I do something."  CSW notified security of this.  CPS is checking into [facility] as a potential placement for the baby.  At this point, CPS has not taken custody of the infant and parents may visit as they wish.  CSW asks that security is called if parents have any unusual or inappropriate behavior at baby's bedside.

## 2012-11-15 NOTE — Progress Notes (Signed)
Patient declined pneumonia vaccine prior to discharge.

## 2012-11-15 NOTE — Progress Notes (Signed)
CSW completed assessment with MOB. Full documentation to follow. CSW has significant concerns and CPS is involved. Infant in NICU. MOB can be discharged when medically ready.  

## 2012-11-15 NOTE — Progress Notes (Signed)
Clinical Social Work Department PSYCHOSOCIAL ASSESSMENT - MATERNAL/CHILD 11/14/2012  Patient:  Misty Reynolds,Misty Reynolds  Account Number:  [id number]  Admit Date:  11/13/2012  Childs Name:   Billy Karl "Watson" Reynolds    Clinical Social Worker:  Briani Maul, LCSW   Date/Time:  11/14/2012 11:30 AM  Date Referred:  11/14/2012   Referral source  NICU     Referred reason  Substance Abuse  NICU   Other referral source:    I:  FAMILY / HOME ENVIRONMENT Child's legal guardian:  PARENT  Guardian - Name Guardian - Age Guardian - Address  Misty Reynolds 27 [address] [place], [place] [address]  Misty Reynolds  same   Other household support members/support persons Other support:   FOB states his mother and grandmother are their support people.  MOB states her mother is supportive, but lives in [place].    II  PSYCHOSOCIAL DATA Information Source:  Family Interview  Financial and Community Resources Employment:   MOB states she was recently employed, but not during pregnancy.  She plans to seek employment as soon as possible now that the baby has been born.  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   Financial resources:  Medicaid If Medicaid - County:  [place] Other  WIC  Food Stamps   School / Grade:   Maternity Care Coordinator / Child Services Coordination / Early Interventions:   [facility]  Cultural issues impacting care:   None stated    III  STRENGTHS Strengths  Adequate Resources  Home prepared for Child (including basic supplies)  Supportive family/friends   Strength comment:    IV  RISK FACTORS AND CURRENT PROBLEMS Current Problem:  YES   Risk Factor & Current Problem Patient Issue Family Issue Risk Factor / Current Problem Comment  DSS Involvement Y Y Parents' three year old son has been removed from their custody and placed with a paternal aunt.  Mental Illness Y Y Both parents suffer from severe Anxiety.  Substance Abuse Y N Concern for abuse  of prescription medication and possibly other opiates.   N N     V  SOCIAL WORK ASSESSMENT  CSW met with MOB and FOB in MOB's first floor room to complete assessment due to substance abuse and anxiety and offer support due to NICU admission.  Parents were pleasant and welcomed CSW into the room.  They report that baby was admitted to NICU because of having a bowel movement in utero and breathing it in resulting in respiratory distress.  FOB adds that he missed the birth because he was told that it would take a long time and went home to get some clothes.  When he came back and saw the baby in the CN with O2, he passed out.  He also states that the baby was "early" and that he was not due until Monday.  CSW explained that it is common for babies to not be born on their actual due date, but any day around that date is normal.  He interrupted and said his other two children were born on their due dates.  CSW offered the information that baby is considered full term at the gestation he was at birth.  CSW was aware of CPS involvement with the couple's older child prior to entering the room based on chart review, but gave them the opportunity to tell CSW about this.  CSW asked about their 3 year old son and both parents seemed very   limited in their answers and became suspicious.  CSW informed them that CSW is aware of CPS involvement and parents became defensive and asked why CSW would be "bringing up the past" and wanted to know "what this baby has to do with Joseph."  CSW explained that when the issues that caused CPS to become involved with one child have not been resolved and another baby is born, CPS is going to be involved with the new baby as well.  Parents were visibly upset and FOB asked CSW not to call CPS at least for a few days.  CSW informed them that CPS has already been notified that the baby was born.  FOB states he does not want [place] CPS to be involved and that CSW can now consider MOB  a [place] resident because he is going to move her belongings to his mother's house this afternoon so that [place] CPS will not be able to "take his baby."  CSW explained that it did not matter what county they live in because the issues are still the same.  FOB states he will be staying at the [address] address in [place] and MOB and the baby will be living with his mother at [address] [place], [place] [address].  CSW was later informed by Birth Registry staff that FOB went to their office and requested to change the address on the birth certificate.  CSW asked MOB to state what types of medications or drugs she took while she was pregnant.  MOB remained calm during our discussion and states she took prescribed cough syrup with codiene and that she takes Xanax for Anxiety.  As CSW observed FOB, he appeared to be under the influence of something as his speech was difficult to understand and he was difficult to redirect when he began to ramble about whatever it was he was talking about.  His eyes crossed at times and he bobbed often.  CSW asked if FOB is taking any medication and he declined to answer.  CSW asked if either parent is seeing a counselor and MOB states she receives counseling at [facility].  FOB remained silent.  FOB states they were forced by CPS to see a psychiatrist, who "showed us ink blots to determine if we were good parents."  He added that the doctor concluded that MOB was "retarded."  FOB became highly agitated and upset throughout the discussion and CSW was unable to de-escalate him.  He called his mother and stated "the case worker is in here and they are going to take my baby."  He passed the phone to CSW and CSW explained that CSW does not work for Child Protective Services, but suggested that PGM come to the hospital as a support for parents if possible.  Parents were stating to CSW at this time that they wanted [facility] to "adopt" the baby.  Then FOB stated he was having one of his  "episodes" and was going to have a "seizure."  He proceeded to tip his head back on the window sill and violently twitch his left shoulder and blink his eyes.  He began breathing quite loudly and hoarsely.  CSW asked his mother on the phone if her son has a hx of seizures.  She states he has panic attacks.  CSW quickly got off the phone with PGM and tried to get FOB's attention.  He was unresponsive and CSW called for MOB's RN to come immediately.  Meanwhile, MOB had   answered the hospital room phone and was talking, completely unphased by FOB's episode.  RN and tech arrived with smelling salts and a blood pressure machine, and when they reached him on the other side of the room he was whispering "panic attack.  Panic attack."  He sat straight up when he smelled the salts and told them to get away because he was fine.  He refused to allow RN to call 911 and said he just needed to go outside to smoke a cigarette.  Assistance Unit director and another RN came into room to attend to the situation as well, but FOB promised he was fine.  He eventually left the room and CSW continued to talk with MOB briefly.  CSW spoke about PPD and the difficulty of the situation.  CSW recommends mental health treatment for both parents.  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  MOB appears appropriate.  CSW provided her with contact information and asked her to call if she would like to talk at any time.  CSW has significant concerns and will continue to follow closely.  The CPS case has been assigned to Melissa McClary at [place] [employer].  CSW spent nearly two hours with parents today.    VI SOCIAL WORK PLAN Social Work Plan  Child Protective Services Report  Psychosocial Support/Ongoing Assessment of Needs  Patient/Family Education  Information/Referral to Community Resources   Type of pt/family education:   What to expect when a baby is in NICU  for NAS  PPD signs and symptoms  Importance of mental health treatmen   If child protective services report - county:  [place] If child protective services report - date:  11/14/2012 Information/referral to community resources comment:   CSW offered to refer parents to mental health services, but MOB states she is linked with services at [facility] and FOB chose not to talk about this aspect of his life.   Other social work plan:  CSW will monitor meconium drug screen.  

## 2012-11-15 NOTE — Discharge Summary (Cosign Needed)
Obstetric Discharge Summary Reason for Admission: onset of labor Prenatal Procedures: none Intrapartum Procedures: spontaneous vaginal delivery Postpartum Procedures: none Complications-Operative and Postpartum: none Hemoglobin  Date Value Range Status  11/13/2012 13.7  12.0 - 15.0 g/dL Final     HCT  Date Value Range Status  11/13/2012 38.4  36.0 - 46.0 % Final    Physical Exam:  General: alert, cooperative, appears stated age and no distress Lochia: appropriate Uterine Fundus: firm CTAB no wrc RRR no mgt  DVT Evaluation: No evidence of DVT seen on physical exam. Negative Homan's sign. No cords or calf tenderness.  Discharge Diagnoses: Term Pregnancy-delivered and Infant will stay in NICU for withdrawal  Discharge Information: Date: 11/15/2012 Activity: pelvic rest Diet: routine Medications: PNV and Ibuprofen Condition: stable Instructions: refer to practice specific booklet Discharge to: home Follow-up Information   Follow up with [facility]. Schedule an appointment as soon as possible for a visit in 4 weeks. (4 week post partum visit)    Specialty:  Obstetrics and Gynecology   Contact information:   [address] [place] [address] [phone]      Newborn Data: Live born female  Birth Weight: 7 lb 1.6 oz (3221 g) APGAR: 8, 8  STaying in NICU for withdrawal. Pending CPS evaluation and social work consult.    Jolyn Lent RYAN 11/15/2012, 9:09 AM

## 2012-11-20 ENCOUNTER — Encounter: Payer: Medicaid Other | Admitting: Obstetrics & Gynecology

## 2012-12-02 ENCOUNTER — Encounter: Payer: Self-pay | Admitting: Women's Health

## 2012-12-03 ENCOUNTER — Telehealth: Payer: Self-pay | Admitting: Obstetrics and Gynecology

## 2012-12-03 NOTE — Telephone Encounter (Signed)
Spoke with pt. Pt states on My Chart it says she has MRSA. I reviewed pt's chart but saw MRSA was negative. Discussed this with pt. Advised pt schedule an appt for recheck. Pt will call back and schedule appt. JSY

## 2012-12-03 NOTE — Telephone Encounter (Signed)
Left message x 1. JSY 

## 2013-12-02 ENCOUNTER — Encounter (HOSPITAL_COMMUNITY): Payer: Self-pay | Admitting: *Deleted

## 2014-02-14 ENCOUNTER — Encounter (HOSPITAL_COMMUNITY): Payer: Self-pay | Admitting: *Deleted

## 2014-02-14 ENCOUNTER — Emergency Department (HOSPITAL_COMMUNITY): Payer: No Typology Code available for payment source

## 2014-02-14 ENCOUNTER — Emergency Department (HOSPITAL_COMMUNITY)
Admission: EM | Admit: 2014-02-14 | Discharge: 2014-02-14 | Disposition: A | Discharge status: Home / Self Care | Payer: No Typology Code available for payment source | Attending: Emergency Medicine | Admitting: Emergency Medicine

## 2014-02-14 DIAGNOSIS — S199XXA Unspecified injury of neck, initial encounter: Secondary | ICD-10-CM | POA: Diagnosis not present

## 2014-02-14 DIAGNOSIS — Y998 Other external cause status: Secondary | ICD-10-CM | POA: Insufficient documentation

## 2014-02-14 DIAGNOSIS — Z79899 Other long term (current) drug therapy: Secondary | ICD-10-CM | POA: Insufficient documentation

## 2014-02-14 DIAGNOSIS — R079 Chest pain, unspecified: Secondary | ICD-10-CM | POA: Diagnosis not present

## 2014-02-14 DIAGNOSIS — Z8614 Personal history of Methicillin resistant Staphylococcus aureus infection: Secondary | ICD-10-CM | POA: Diagnosis not present

## 2014-02-14 DIAGNOSIS — Y92410 Unspecified street and highway as the place of occurrence of the external cause: Secondary | ICD-10-CM | POA: Diagnosis not present

## 2014-02-14 DIAGNOSIS — F419 Anxiety disorder, unspecified: Secondary | ICD-10-CM | POA: Diagnosis not present

## 2014-02-14 DIAGNOSIS — S0990XA Unspecified injury of head, initial encounter: Secondary | ICD-10-CM | POA: Diagnosis present

## 2014-02-14 DIAGNOSIS — Y9389 Activity, other specified: Secondary | ICD-10-CM | POA: Diagnosis not present

## 2014-02-14 DIAGNOSIS — Z792 Long term (current) use of antibiotics: Secondary | ICD-10-CM | POA: Insufficient documentation

## 2014-02-14 DIAGNOSIS — S299XXA Unspecified injury of thorax, initial encounter: Secondary | ICD-10-CM | POA: Insufficient documentation

## 2014-02-14 DIAGNOSIS — Z791 Long term (current) use of non-steroidal anti-inflammatories (NSAID): Secondary | ICD-10-CM | POA: Diagnosis not present

## 2014-02-14 DIAGNOSIS — S3992XA Unspecified injury of lower back, initial encounter: Secondary | ICD-10-CM | POA: Diagnosis not present

## 2014-02-14 LAB — BASIC METABOLIC PANEL
Anion gap: 8 (ref 5–15)
BUN: 8 mg/dL (ref 6–23)
CHLORIDE: 105 meq/L (ref 96–112)
CO2: 26 mmol/L (ref 19–32)
Calcium: 9.1 mg/dL (ref 8.4–10.5)
Creatinine, Ser: 0.72 mg/dL (ref 0.50–1.10)
GFR calc Af Amer: >90 mL/min (ref >90)
GFR calc non Af Amer: >90 mL/min (ref >90)
Glucose, Bld: 86 mg/dL (ref 70–99)
Potassium: 3.2 mmol/L — ABNORMAL LOW (ref 3.5–5.1)
SODIUM: 139 mmol/L (ref 135–145)

## 2014-02-14 LAB — CBC WITH DIFFERENTIAL/PLATELET
BASOS PCT: 0 % (ref 0–1)
Basophils Absolute: 0 10*3/uL (ref 0.0–0.1)
EOS ABS: 0 10*3/uL (ref 0.0–0.7)
EOS PCT: 1 % (ref 0–5)
HEMATOCRIT: 40.1 % (ref 36.0–46.0)
Hemoglobin: 13.6 g/dL (ref 12.0–15.0)
LYMPHS ABS: 1.2 10*3/uL (ref 0.7–4.0)
Lymphocytes Relative: 24 % (ref 12–46)
MCH: 29.8 pg (ref 26.0–34.0)
MCHC: 33.9 g/dL (ref 30.0–36.0)
MCV: 87.7 fL (ref 78.0–100.0)
Monocytes Absolute: 0.3 10*3/uL (ref 0.1–1.0)
Monocytes Relative: 7 % (ref 3–12)
NEUTROS ABS: 3.2 10*3/uL (ref 1.7–7.7)
Neutrophils Relative %: 68 % (ref 43–77)
Platelets: 182 10*3/uL (ref 150–400)
RBC: 4.57 MIL/uL (ref 3.87–5.11)
RDW: 11.7 % (ref 11.5–15.5)
WBC: 4.7 10*3/uL (ref 4.0–10.5)

## 2014-02-14 LAB — HCG, SERUM, QUALITATIVE: Preg, Serum: NEGATIVE

## 2014-02-14 MED ORDER — ONDANSETRON HCL 4 MG/2ML IJ SOLN
4.0000 mg | Freq: Once | INTRAMUSCULAR | Status: AC
  Administered 2014-02-14: 4 mg via INTRAVENOUS
  Filled 2014-02-14: qty 2

## 2014-02-14 MED ORDER — IOHEXOL 300 MG/ML  SOLN
100.0000 mL | Freq: Once | INTRAMUSCULAR | Status: AC | PRN
  Administered 2014-02-14: 100 mL via INTRAVENOUS

## 2014-02-14 MED ORDER — HYDROCODONE-ACETAMINOPHEN 5-325 MG PO TABS: 1.0000 | ORAL_TABLET | Freq: Four times a day (QID) | ORAL | Status: DC | PRN

## 2014-02-14 MED ORDER — SODIUM CHLORIDE 0.9 % IV BOLUS (SEPSIS)
1000.0000 mL | Freq: Once | INTRAVENOUS | Status: AC
  Administered 2014-02-14: 1000 mL via INTRAVENOUS

## 2014-02-14 MED ORDER — HYDROMORPHONE HCL 1 MG/ML IJ SOLN
1.0000 mg | Freq: Once | INTRAMUSCULAR | Status: AC
  Administered 2014-02-14: 1 mg via INTRAVENOUS
  Filled 2014-02-14: qty 1

## 2014-02-14 MED ORDER — NAPROXEN 500 MG PO TABS
500.0000 mg | ORAL_TABLET | Freq: Two times a day (BID) | ORAL | Status: DC
Start: 2014-02-14 — End: 2015-04-01

## 2014-02-14 MED ORDER — SODIUM CHLORIDE 0.9 % IV SOLN: INTRAVENOUS | Status: DC
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
## 2015-04-01 ENCOUNTER — Encounter (HOSPITAL_COMMUNITY): Payer: Self-pay | Admitting: Emergency Medicine

## 2015-04-01 ENCOUNTER — Emergency Department (HOSPITAL_COMMUNITY): Payer: Self-pay

## 2015-04-01 ENCOUNTER — Emergency Department (HOSPITAL_COMMUNITY)
Admission: EM | Admit: 2015-04-01 | Discharge: 2015-04-01 | Disposition: A | Discharge status: Home / Self Care | Payer: Self-pay | Attending: Emergency Medicine | Admitting: Emergency Medicine

## 2015-04-01 DIAGNOSIS — Y9389 Activity, other specified: Secondary | ICD-10-CM | POA: Insufficient documentation

## 2015-04-01 DIAGNOSIS — W07XXXA Fall from chair, initial encounter: Secondary | ICD-10-CM | POA: Insufficient documentation

## 2015-04-01 DIAGNOSIS — Z8619 Personal history of other infectious and parasitic diseases: Secondary | ICD-10-CM | POA: Insufficient documentation

## 2015-04-01 DIAGNOSIS — Z8614 Personal history of Methicillin resistant Staphylococcus aureus infection: Secondary | ICD-10-CM | POA: Insufficient documentation

## 2015-04-01 DIAGNOSIS — Z79899 Other long term (current) drug therapy: Secondary | ICD-10-CM | POA: Insufficient documentation

## 2015-04-01 DIAGNOSIS — F419 Anxiety disorder, unspecified: Secondary | ICD-10-CM | POA: Insufficient documentation

## 2015-04-01 DIAGNOSIS — Y9289 Other specified places as the place of occurrence of the external cause: Secondary | ICD-10-CM | POA: Insufficient documentation

## 2015-04-01 DIAGNOSIS — F17210 Nicotine dependence, cigarettes, uncomplicated: Secondary | ICD-10-CM | POA: Insufficient documentation

## 2015-04-01 DIAGNOSIS — Y998 Other external cause status: Secondary | ICD-10-CM | POA: Insufficient documentation

## 2015-04-01 DIAGNOSIS — S20211A Contusion of right front wall of thorax, initial encounter: Secondary | ICD-10-CM | POA: Insufficient documentation

## 2015-04-01 MED ORDER — NAPROXEN 500 MG PO TABS: 500.0000 mg | ORAL_TABLET | Freq: Two times a day (BID) | ORAL | Status: DC

## 2015-04-01 MED ORDER — HYDROCODONE-ACETAMINOPHEN 5-325 MG PO TABS: 1.0000 | ORAL_TABLET | ORAL | Status: DC | PRN

## 2015-04-01 MED ORDER — KETOROLAC TROMETHAMINE 30 MG/ML IJ SOLN
30.0000 mg | Freq: Once | INTRAMUSCULAR | Status: AC
  Administered 2015-04-01: 30 mg via INTRAMUSCULAR
  Filled 2015-04-01: qty 1

## 2015-04-01 MED ORDER — HYDROCODONE-ACETAMINOPHEN 5-325 MG PO TABS
1.0000 | ORAL_TABLET | Freq: Once | ORAL | Status: AC
  Administered 2015-04-01: 1 via ORAL
  Filled 2015-04-01: qty 1

## 2015-04-01 NOTE — ED Provider Notes (Addendum)
CSN: [id number]     Arrival date & time 04/01/15  0143 History   First MD Initiated Contact with Patient 04/01/15 0206     Chief Complaint  Patient presents with  . Chest Pain     (Consider location/radiation/quality/duration/timing/severity/associated sxs/prior Treatment) HPI  This is a 30 year old female who presents following a fall. Patient reports that she fell onto a chair on Monday night. She reports right-sided chest pain. Worse with breathing. States that it is sharp. Currently her pain is 10 out of 10. She denies any other injury. She is not taking anything at home for the pain. She denies shortness of breath, back pain, or abdominal pain.  Past Medical History  Diagnosis Date  . Anxiety   . Staph infection     left breast  . MRSA (methicillin resistant Staphylococcus aureus)    Past Surgical History  Procedure Laterality Date  . No past surgeries     History reviewed. No pertinent family history. Social History  Substance Use Topics  . Smoking status: Current Every Day Smoker -- 0.50 packs/day    Types: Cigarettes  . Smokeless tobacco: Never Used  . Alcohol Use: No   OB History    Gravida Para Term Preterm AB TAB SAB Ectopic Multiple Living   Review of Systems  Respiratory: Negative for shortness of breath.   Cardiovascular: Positive for chest pain.  Musculoskeletal: Negative for back pain.  All other systems reviewed and are negative.     Allergies  Review of patient's allergies indicates no known allergies.  Home Medications   Prior to Admission medications   Medication Sig Start Date End Date Taking? Authorizing Provider  acetaminophen (TYLENOL) 500 MG tablet Take 1,000 mg by mouth every 6 (six) hours as needed.   Yes Historical Provider, MD  ALPRAZolam Prudy Feeler) 0.5 MG tablet Take 0.5 mg by mouth 6 (six) times daily.    Yes Historical Provider, MD  ibuprofen (ADVIL,MOTRIN) 600 MG tablet Take 1 tablet (600 mg total) by mouth every 6  (six) hours. 11/15/12  Yes Minta Balsam, MD  Norgestimate-Ethinyl Estradiol Triphasic 0.18/0.215/0.25 MG-35 MCG tablet Take 1 tablet by mouth daily.   Yes Historical Provider, MD  prenatal vitamin w/FE, FA (PRENATAL 1 + 1) 27-1 MG TABS Take 1 tablet by mouth daily. 08/08/12  Yes Lazaro Arms, MD  clindamycin (CLEOCIN) 300 MG capsule Take 1 capsule (300 mg total) by mouth 3 (three) times daily. Patient not taking: Reported on 02/14/2014 10/26/12   Lazaro Arms, MD  HYDROcodone-acetaminophen (NORCO/VICODIN) 5-325 MG tablet Take 1 tablet by mouth every 4 (four) hours as needed for moderate pain. 04/01/15   Shon Baton, MD  HYDROcodone-homatropine (HYCODAN) 5-1.5 MG/5ML syrup Take 2.5 mLs by mouth at bedtime as needed for cough. Patient not taking: Reported on 02/14/2014 07/17/12   Cheral Marker, CNM  naproxen (NAPROSYN) 500 MG tablet Take 1 tablet (500 mg total) by mouth 2 (two) times daily. 04/01/15   Shon Baton, MD   BP 106/84 mmHg  Pulse 76  Temp(Src) 98.7 F (37.1 C) (Oral)  Resp 16  Ht  (1.676 m)  Wt 116 lb (52.617 kg)  BMI 18.73 kg/m2  SpO2 100%  LMP 03/01/2015 Physical Exam  Constitutional: She is oriented to person, place, and time.  Appears older than stated age, thin  HENT:  Head: Normocephalic and atraumatic.  Cardiovascular: Normal rate, regular rhythm  and normal heart sounds.   No murmur heard. Pulmonary/Chest: Effort normal and breath sounds normal. No respiratory distress. She has no wheezes. She exhibits tenderness.  Tenderness palpation, right chest wall, no crepitus, no obvious bruising or contusion  Abdominal: Soft. Bowel sounds are normal. There is no tenderness. There is no rebound.  Neurological: She is alert and oriented to person, place, and time.  Skin: Skin is warm and dry.  Psychiatric: She has a normal mood and affect.  Nursing note and vitals reviewed.   ED Course  Procedures (including critical care time) Labs Review Labs Reviewed - No  data to display  Imaging Review Dg Ribs Unilateral W/chest Right  04/01/2015  CLINICAL DATA:  30 year old female with fall and right rib pain. EXAM: RIGHT RIBS AND CHEST - 3+ VIEW COMPARISON:  None. FINDINGS: No fracture or other bone lesions are seen involving the ribs. There is no evidence of pneumothorax or pleural effusion. Both lungs are clear. Heart size and mediastinal contours are within normal limits. IMPRESSION: No rib fracture or pneumothorax. Electronically Signed   By: Elgie Collard M.D.   On: 04/01/2015 02:12   I have personally reviewed and evaluated these images and lab results as part of my medical decision-making.   EKG Interpretation None      MDM   Final diagnoses:  Chest wall contusion, right, initial encounter    Patient presents with right chest wall pain following a fall. Nontoxic on exam. Tender to palpation without obvious crepitus. Pulmonary exam is reassuring. Patient was given Toradol and Norco. X-rays negative for rib fracture. Suspect chest wall contusion versus occult rib fracture. She'll be discharged home with naproxen and a short course of Norco.  [place] controlled substances database reviewed and without any recent narcotic prescriptions.  After history, exam, and medical workup I feel the patient has been appropriately medically screened and is safe for discharge home. Pertinent diagnoses were discussed with the patient. Patient was given return precautions.     Shon Baton, MD 04/01/15 1610  Shon Baton, MD 04/01/15 [phone]

## 2015-04-01 NOTE — ED Notes (Signed)
Pt c/o right rib pain after falling Monday night.

## 2015-04-01 NOTE — Discharge Instructions (Signed)
Blunt Chest Trauma °Blunt chest trauma is an injury caused by a blow to the chest. These chest injuries can be very painful. Blunt chest trauma often results in bruised or broken (fractured) ribs. Most cases of bruised and fractured ribs from blunt chest traumas get better after 1 to 3 weeks of rest and pain medicine. Often, the soft tissue in the chest wall is also injured, causing pain and bruising. Internal organs, such as the heart and lungs, may also be injured. Blunt chest trauma can lead to serious medical problems. This injury requires immediate medical care. °CAUSES  °· Motor vehicle collisions. °· Falls. °· Physical violence. °· Sports injuries. °SYMPTOMS  °· Chest pain. The pain may be worse when you move or breathe deeply. °· Shortness of breath. °· Lightheadedness. °· Bruising. °· Tenderness. °· Swelling. °DIAGNOSIS  °Your caregiver will do a physical exam. X-rays may be taken to look for fractures. However, minor rib fractures may not show up on X-rays until a few days after the injury. If a more serious injury is suspected, further imaging tests may be done. This may include ultrasounds, computed tomography (CT) scans, or magnetic resonance imaging (MRI). °TREATMENT  °Treatment depends on the severity of your injury. Your caregiver may prescribe pain medicines and deep breathing exercises. °HOME CARE INSTRUCTIONS °· Limit your activities until you can move around without much pain. °· Do not do any strenuous work until your injury is healed. °· Put ice on the injured area. °· Put ice in a plastic bag. °· Place a towel between your skin and the bag. °· Leave the ice on for 15-20 minutes, 03-04 times a day. °· You may wear a rib belt as directed by your caregiver to reduce pain. °· Practice deep breathing as directed by your caregiver to keep your lungs clear. °· Only take over-the-counter or prescription medicines for pain, fever, or discomfort as directed by your caregiver. °SEEK IMMEDIATE MEDICAL  CARE IF:  °· You have increasing pain or shortness of breath. °· You cough up blood. °· You have nausea, vomiting, or abdominal pain. °· You have a fever. °· You feel dizzy, weak, or you faint. °MAKE SURE YOU: °· Understand these instructions. °· Will watch your condition. °· Will get help right away if you are not doing well or get worse. °  °This information is not intended to replace advice given to you by your health care provider. Make sure you discuss any questions you have with your health care provider. °  °Document Released: 02/25/2004 Document Revised: 02/07/2014 Document Reviewed: 07/16/2014 °Elsevier Interactive Patient Education ©2016 Elsevier Inc. ° °Chest Contusion °A chest contusion is a deep bruise on your chest area. Contusions are the result of an injury that caused bleeding under the skin. A chest contusion may involve bruising of the skin, muscles, or ribs. The contusion may turn blue, purple, or yellow. Minor injuries will give you a painless contusion, but more severe contusions may stay painful and swollen for a few weeks. °CAUSES  °A contusion is usually caused by a blow, trauma, or direct force to an area of the body. °SYMPTOMS  °· Swelling and redness of the injured area. °· Discoloration of the injured area. °· Tenderness and soreness of the injured area. °· Pain. °DIAGNOSIS  °The diagnosis can be made by taking a history and performing a physical exam. An X-ray, CT scan, or MRI may be needed to determine if there were any associated injuries, such as broken bones (fractures)   or internal injuries. °TREATMENT  °Often, the best treatment for a chest contusion is resting, icing, and applying cold compresses to the injured area. Deep breathing exercises may be recommended to reduce the risk of pneumonia. Over-the-counter medicines may also be recommended for pain control. °HOME CARE INSTRUCTIONS  °· Put ice on the injured area. °¨ Put ice in a plastic bag. °¨ Place a towel between your skin  and the bag. °¨ Leave the ice on for 15-20 minutes, 03-04 times a day. °· Only take over-the-counter or prescription medicines as directed by your caregiver. Your caregiver may recommend avoiding anti-inflammatory medicines (aspirin, ibuprofen, and naproxen) for 48 hours because these medicines may increase bruising. °· Rest the injured area. °· Perform deep-breathing exercises as directed by your caregiver. °· Stop smoking if you smoke. °· Do not lift objects over 5 pounds (2.3 kg) for 3 days or longer if recommended by your caregiver. °SEEK IMMEDIATE MEDICAL CARE IF:  °· You have increased bruising or swelling. °· You have pain that is getting worse. °· You have difficulty breathing. °· You have dizziness, weakness, or fainting. °· You have blood in your urine or stool. °· You cough up or vomit blood. °· Your swelling or pain is not relieved with medicines. °MAKE SURE YOU:  °· Understand these instructions. °· Will watch your condition. °· Will get help right away if you are not doing well or get worse. °  °This information is not intended to replace advice given to you by your health care provider. Make sure you discuss any questions you have with your health care provider. °  °Document Released: 10/12/2000 Document Revised: 10/12/2011 Document Reviewed: 07/11/2011 °Elsevier Interactive Patient Education ©2016 Elsevier Inc. ° °

## 2015-05-08 ENCOUNTER — Encounter (HOSPITAL_COMMUNITY): Payer: Self-pay | Admitting: Emergency Medicine

## 2015-05-08 ENCOUNTER — Emergency Department (HOSPITAL_COMMUNITY)
Admission: EM | Admit: 2015-05-08 | Discharge: 2015-05-08 | Disposition: A | Discharge status: Home / Self Care | Payer: No Typology Code available for payment source | Attending: Emergency Medicine | Admitting: Emergency Medicine

## 2015-05-08 DIAGNOSIS — Z8614 Personal history of Methicillin resistant Staphylococcus aureus infection: Secondary | ICD-10-CM | POA: Insufficient documentation

## 2015-05-08 DIAGNOSIS — Z793 Long term (current) use of hormonal contraceptives: Secondary | ICD-10-CM | POA: Insufficient documentation

## 2015-05-08 DIAGNOSIS — F410 Panic disorder [episodic paroxysmal anxiety] without agoraphobia: Secondary | ICD-10-CM | POA: Insufficient documentation

## 2015-05-08 DIAGNOSIS — Z791 Long term (current) use of non-steroidal anti-inflammatories (NSAID): Secondary | ICD-10-CM | POA: Insufficient documentation

## 2015-05-08 DIAGNOSIS — Z79899 Other long term (current) drug therapy: Secondary | ICD-10-CM | POA: Insufficient documentation

## 2015-05-08 DIAGNOSIS — Z8619 Personal history of other infectious and parasitic diseases: Secondary | ICD-10-CM | POA: Insufficient documentation

## 2015-05-08 DIAGNOSIS — F17210 Nicotine dependence, cigarettes, uncomplicated: Secondary | ICD-10-CM | POA: Insufficient documentation

## 2015-05-08 LAB — CBC WITH DIFFERENTIAL/PLATELET
BASOS ABS: 0 10*3/uL (ref 0.0–0.1)
Basophils Relative: 1 %
EOS PCT: 1 %
Eosinophils Absolute: 0.1 10*3/uL (ref 0.0–0.7)
HCT: 40.5 % (ref 36.0–46.0)
Hemoglobin: 13.5 g/dL (ref 12.0–15.0)
LYMPHS ABS: 1.8 10*3/uL (ref 0.7–4.0)
LYMPHS PCT: 36 %
MCH: 28.5 pg (ref 26.0–34.0)
MCHC: 33.3 g/dL (ref 30.0–36.0)
MCV: 85.4 fL (ref 78.0–100.0)
MONO ABS: 0.4 10*3/uL (ref 0.1–1.0)
Monocytes Relative: 7 %
Neutro Abs: 2.8 10*3/uL (ref 1.7–7.7)
Neutrophils Relative %: 55 %
PLATELETS: 181 10*3/uL (ref 150–400)
RBC: 4.74 MIL/uL (ref 3.87–5.11)
RDW: 12.3 % (ref 11.5–15.5)
WBC: 5.1 10*3/uL (ref 4.0–10.5)

## 2015-05-08 LAB — ETHANOL: Alcohol, Ethyl (B): <5 mg/dL (ref <5)

## 2015-05-08 LAB — COMPREHENSIVE METABOLIC PANEL
ALBUMIN: 4.1 g/dL (ref 3.5–5.0)
ALT: 107 U/L — ABNORMAL HIGH (ref 14–54)
AST: 68 U/L — AB (ref 15–41)
Alkaline Phosphatase: 60 U/L (ref 38–126)
Anion gap: 11 (ref 5–15)
BUN: 9 mg/dL (ref 6–20)
CO2: 27 mmol/L (ref 22–32)
Calcium: 9.3 mg/dL (ref 8.9–10.3)
Chloride: 100 mmol/L — ABNORMAL LOW (ref 101–111)
Creatinine, Ser: 0.76 mg/dL (ref 0.44–1.00)
GFR calc Af Amer: >60 mL/min (ref >60)
GFR calc non Af Amer: >60 mL/min (ref >60)
GLUCOSE: 81 mg/dL (ref 65–99)
Potassium: 3.6 mmol/L (ref 3.5–5.1)
Sodium: 138 mmol/L (ref 135–145)
TOTAL PROTEIN: 7.7 g/dL (ref 6.5–8.1)
Total Bilirubin: 0.8 mg/dL (ref 0.3–1.2)

## 2015-05-08 MED ORDER — IBUPROFEN 400 MG PO TABS: 400.0000 mg | ORAL_TABLET | Freq: Four times a day (QID) | ORAL | Status: DC | PRN

## 2015-05-08 MED ORDER — NALOXONE HCL 0.4 MG/ML IJ SOLN
0.4000 mg | Freq: Once | INTRAMUSCULAR | Status: AC
  Administered 2015-05-08: 0.4 mg via INTRAVENOUS
  Filled 2015-05-08: qty 1

## 2015-05-08 MED ORDER — ACETAMINOPHEN 325 MG PO TABS
650.0000 mg | ORAL_TABLET | Freq: Once | ORAL | Status: AC
  Administered 2015-05-08: 650 mg via ORAL
  Filled 2015-05-08: qty 2

## 2015-05-08 MED ORDER — SODIUM CHLORIDE 0.9 % IV BOLUS (SEPSIS)
500.0000 mL | Freq: Once | INTRAVENOUS | Status: AC
  Administered 2015-05-08: 500 mL via INTRAVENOUS

## 2015-05-08 MED ORDER — HYDROXYZINE HCL 10 MG PO TABS: 10.0000 mg | ORAL_TABLET | Freq: Four times a day (QID) | ORAL | Status: DC | PRN

## 2015-05-08 NOTE — ED Notes (Signed)
Pt arrives from [facility] where she was staying with a patient upstairs. Pt was initially tried to arouse by staff upstairs and was unresposnsive. Brought to ED via wheelchair by staff. Pt states she has severe anxiety and panic attacks. Pt currently hyperventilating, awake, alert, oriented x4, VSS. States usually takes xanax for anxiety.

## 2015-05-08 NOTE — Discharge Instructions (Signed)
Panic Attacks °Panic attacks are sudden, short-lived surges of severe anxiety, fear, or discomfort. They may occur for no reason when you are relaxed, when you are anxious, or when you are sleeping. Panic attacks may occur for a number of reasons:  °· Healthy people occasionally have panic attacks in extreme, life-threatening situations, such as war or natural disasters. Normal anxiety is a protective mechanism of the body that helps us react to danger (fight or flight response). °· Panic attacks are often seen with anxiety disorders, such as panic disorder, social anxiety disorder, generalized anxiety disorder, and phobias. Anxiety disorders cause excessive or uncontrollable anxiety. They may interfere with your relationships or other life activities. °· Panic attacks are sometimes seen with other mental illnesses, such as depression and posttraumatic stress disorder. °· Certain medical conditions, prescription medicines, and drugs of abuse can cause panic attacks. °SYMPTOMS  °Panic attacks start suddenly, peak within 20 minutes, and are accompanied by four or more of the following symptoms: °· Pounding heart or fast heart rate (palpitations). °· Sweating. °· Trembling or shaking. °· Shortness of breath or feeling smothered. °· Feeling choked. °· Chest pain or discomfort. °· Nausea or strange feeling in your stomach. °· Dizziness, light-headedness, or feeling like you will faint. °· Chills or hot flushes. °· Numbness or tingling in your lips or hands and feet. °· Feeling that things are not real or feeling that you are not yourself. °· Fear of losing control or going crazy. °· Fear of dying. °Some of these symptoms can mimic serious medical conditions. For example, you may think you are having a heart attack. Although panic attacks can be very scary, they are not life threatening. °DIAGNOSIS  °Panic attacks are diagnosed through an assessment by your health care provider. Your health care provider will ask  questions about your symptoms, such as where and when they occurred. Your health care provider will also ask about your medical history and use of alcohol and drugs, including prescription medicines. Your health care provider may order blood tests or other studies to rule out a serious medical condition. Your health care provider may refer you to a mental health professional for further evaluation. °TREATMENT  °· Most healthy people who have one or two panic attacks in an extreme, life-threatening situation will not require treatment. °· The treatment for panic attacks associated with anxiety disorders or other mental illness typically involves counseling with a mental health professional, medicine, or a combination of both. Your health care provider will help determine what treatment is best for you. °· Panic attacks due to physical illness usually go away with treatment of the illness. If prescription medicine is causing panic attacks, talk with your health care provider about stopping the medicine, decreasing the dose, or substituting another medicine. °· Panic attacks due to alcohol or drug abuse go away with abstinence. Some adults need professional help in order to stop drinking or using drugs. °HOME CARE INSTRUCTIONS  °· Take all medicines as directed by your health care provider.   °· Schedule and attend follow-up visits as directed by your health care provider. It is important to keep all your appointments. °SEEK MEDICAL CARE IF: °· You are not able to take your medicines as prescribed. °· Your symptoms do not improve or get worse. °SEEK IMMEDIATE MEDICAL CARE IF:  °· You experience panic attack symptoms that are different than your usual symptoms. °· You have serious thoughts about hurting yourself or others. °· You are taking medicine for panic attacks and   have a serious side effect. MAKE SURE YOU:  Understand these instructions.  Will watch your condition.  Will get help right away if you are not  doing well or get worse.   This information is not intended to replace advice given to you by your health care provider. Make sure you discuss any questions you have with your health care provider.   Document Released: 01/17/2005 Document Revised: 01/22/2013 Document Reviewed: 08/31/2012 Elsevier Interactive Patient Education 2016 [employer] Substance Abuse Treatment Programs  Intensive Outpatient Programs [facility]     [address]      [place], [place]                   [phone]       [facility] [address] [place], [place] [phone]  [facility]     (Inpatient and outpatient)     [address]           [phone]    [facility] [phone] (Suboxone and Methadone)  [address]      [place], [place] [address]      [phone]       [address] [place], [place] [phone]  [facility] (Outpatient/Inpatient, [facility])    (insurance only) [phone]             [facility] (Groups & Residential) [place], [place] [phone]     [facility]     [address]     [place], [place]      [phone]       [facility] (for caregivers and family) [phone] [address] [place], [place] [phone]      Residential Treatment Programs [facility]      [address], [place], [place] [address]  [phone]       [facility] [address] [place], [place] [address] [phone]  [facility]        [phone]       [facility] [phone]  [facility]             [address]                                         [place], [place]                                                [phone] or [phone]                               [facility] [place] [address] [place], [address] [phone]  [facility]    [address]      [place], [place]     [phone]       [facility] [address] [place], [place] [phone]  [facility]   [address]     [place], [place] [address]     [phone]      Admissions: 8am-3pm M-F  [facility] ([facility]) [address] [place], [place] [phone]  [facility] Program:  Residential Program (90 Days)   °[place], [place]      °336-725-8389 or [phone]    ° °[facility] °[place], [place] °(Walk in Hours over the weekend or by referral) ° °[place] [facility] °[address], [place], [place] [address] °([phone] ° °Crisis Mobile: [facility]:  1-877-626-1772 (for crisis response 24 hours a day) °[facility] Hotline:      1-800-256-2452 °Outpatient Psychiatry and Counseling ° °[facility]: Mobile Crisis Management 24 hours:  1-877-626-1772 ° °[facility] sliding scale fee and walk in schedule: M-F 8am-12pm/1pm-3pm °[address]  °[place], [place] [address] °[phone] ° °[facility] °[address] °[place], [place] [address] °336-703-9650 ° °[facility] ([facility] known [facility])- new patient walk-in appointments available Monday - Friday 8am -3pm.          °[address] °[place], [place] [address] °336-676-6840 or crisis line- 336-676-6905 ° °[facility]/ Intensive Outpatient Therapy Program °[address] °[place], [place] [address] °336-832-9804 ° °[facility]                  °Crisis Services      °[phone]      °[address]     °[place], [place] [address]                ° °[facility]   °[facility] °[phone] °[address] °[place], [place] [address] ° ° °[facility]          °[address]  °[place], [place] [address]       °([phone] ° °[facility] °[address] °[place], [place] [address] °336-292-1510 ° °[facility]    °[address]    °[place], [place] [address]     °[phone]      ° °Parish McKinney, MD     °[address]     °[place] [address]     °336-282-1251     °  °[facility] °[address] °[place] [address] ° °[facility]     °[address]     °[place], [place]      °336-542-2076      ° °[facility] °Shamsher Ahluwalia, MD °[address] °[place], [place] [address] °[phone] ° °[facility]     °[address]     °[place], [place] [address]     °336-274-1237      ° °[facility] °[address] °[place], [place] [address] °336-854-4450 °Resources for Temporary Residential Assistance/Crisis Centers ° °DAY CENTERS °[facility]) °M-F 8am-3pm   °[address] [place], [place] [address]   336-332-0824 °Services include: laundry, barbering, support groups, case management, phone  & computer access, showers, AA/NA mtgs, mental health/substance abuse nurse, job skills class, disability information, VA assistance, spiritual classes, etc.  ° °HOMELESS SHELTERS ° °[facility]     °[facility]   °[address], [place]     °336.271.5959       °       °[facility] (women and children)       °[address] °[place], [place] [address] °336-275-0820 °[email or web address] for application and process °Application Required ° °[facility]   °  [address]    [place] [address]     [phone]                    [facility] [address] [place], [place] [address] [phone](schedule application appt.) Application Required  [facility] ([facility] only)    [address]     [place], [place] [address]     [phone]      Intake starts 6pm daily Need valid ID, SSC, & Police  report [facility] [place] [address] [place], [place] [phone] Application Required  [facility] (men only)     [address]      [place], [place]     [phone]       [facility] (Pregnant women only) [address] [place], [place] [phone]  [facility]      [address]      [place], [place] [address]     [phone]             [facility] [address] [place], [place] [phone] 90 day commitment/SA/Application process  [facility](men only)     [address]     [place], [place]     [phone]       Check-in at [facility]            [facility] [address] [place], [place] [address] [phone] Men/Women/Women and Children must be there by 7 pm  [facility] [place], [place] [phone]

## 2015-05-08 NOTE — ED Notes (Signed)
Per security patient was found in [facility] bathroom lethargic with crack pipe that was confiscated. Patient brought to ED informed by security and [employer] she is not allowed to go back to [facility] to visit boyfriend. Illegal contraband removed by security and patient belongings returned to her. Patient lethargic upon assessment prior to naloxone with repeated questioning. Denies use of any sedating substances. After naloxone patient repeatedly asking for pain medicine stating "my panic attacks cause me to be in pain because I dont have oxygen and it makes my potassium go up". RN placed patient on oxygen and informed PA.
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
## 2016-01-18 DIAGNOSIS — F410 Panic disorder [episodic paroxysmal anxiety] without agoraphobia: Secondary | ICD-10-CM | POA: Insufficient documentation

## 2016-04-30 ENCOUNTER — Emergency Department (HOSPITAL_COMMUNITY): Payer: Medicaid Other

## 2016-04-30 ENCOUNTER — Emergency Department (HOSPITAL_COMMUNITY)
Admission: EM | Admit: 2016-04-30 | Discharge: 2016-05-01 | Disposition: A | Discharge status: Home / Self Care | Payer: Medicaid Other | Attending: Emergency Medicine | Admitting: Emergency Medicine

## 2016-04-30 DIAGNOSIS — F17210 Nicotine dependence, cigarettes, uncomplicated: Secondary | ICD-10-CM | POA: Insufficient documentation

## 2016-04-30 DIAGNOSIS — F1910 Other psychoactive substance abuse, uncomplicated: Secondary | ICD-10-CM | POA: Insufficient documentation

## 2016-04-30 DIAGNOSIS — Z5181 Encounter for therapeutic drug level monitoring: Secondary | ICD-10-CM | POA: Insufficient documentation

## 2016-04-30 DIAGNOSIS — F10921 Alcohol use, unspecified with intoxication delirium: Secondary | ICD-10-CM

## 2016-04-30 DIAGNOSIS — F10121 Alcohol abuse with intoxication delirium: Secondary | ICD-10-CM | POA: Insufficient documentation

## 2016-04-30 LAB — COMPREHENSIVE METABOLIC PANEL
ALBUMIN: 4.1 g/dL (ref 3.5–5.0)
ALK PHOS: 46 U/L (ref 38–126)
ALT: 34 U/L (ref 14–54)
ANION GAP: 8 (ref 5–15)
AST: 29 U/L (ref 15–41)
BILIRUBIN TOTAL: 0.3 mg/dL (ref 0.3–1.2)
BUN: 7 mg/dL (ref 6–20)
CALCIUM: 8.8 mg/dL — AB (ref 8.9–10.3)
CO2: 28 mmol/L (ref 22–32)
CREATININE: 0.6 mg/dL (ref 0.44–1.00)
Chloride: 102 mmol/L (ref 101–111)
GFR calc Af Amer: >60 mL/min (ref >60)
GFR calc non Af Amer: >60 mL/min (ref >60)
GLUCOSE: 93 mg/dL (ref 65–99)
Potassium: 3.5 mmol/L (ref 3.5–5.1)
Sodium: 138 mmol/L (ref 135–145)
TOTAL PROTEIN: 7.7 g/dL (ref 6.5–8.1)

## 2016-04-30 LAB — CBC WITH DIFFERENTIAL/PLATELET
Basophils Absolute: 0 10*3/uL (ref 0.0–0.1)
Basophils Relative: 0 %
EOS ABS: 0.1 10*3/uL (ref 0.0–0.7)
EOS PCT: 2 %
HCT: 40.9 % (ref 36.0–46.0)
Hemoglobin: 14.4 g/dL (ref 12.0–15.0)
LYMPHS ABS: 1.4 10*3/uL (ref 0.7–4.0)
Lymphocytes Relative: 30 %
MCH: 30.6 pg (ref 26.0–34.0)
MCHC: 35.2 g/dL (ref 30.0–36.0)
MCV: 87 fL (ref 78.0–100.0)
MONO ABS: 0.4 10*3/uL (ref 0.1–1.0)
MONOS PCT: 8 %
Neutro Abs: 2.7 10*3/uL (ref 1.7–7.7)
Neutrophils Relative %: 60 %
PLATELETS: 153 10*3/uL (ref 150–400)
RBC: 4.7 MIL/uL (ref 3.87–5.11)
RDW: 12.3 % (ref 11.5–15.5)
WBC: 4.5 10*3/uL (ref 4.0–10.5)

## 2016-04-30 LAB — URINALYSIS, ROUTINE W REFLEX MICROSCOPIC
BACTERIA UA: NONE SEEN
Bilirubin Urine: NEGATIVE
Glucose, UA: NEGATIVE mg/dL
Hgb urine dipstick: NEGATIVE
KETONES UR: NEGATIVE mg/dL
Nitrite: NEGATIVE
PROTEIN: NEGATIVE mg/dL
Specific Gravity, Urine: 1.004 — ABNORMAL LOW (ref 1.005–1.030)
pH: 6 (ref 5.0–8.0)

## 2016-04-30 LAB — PREGNANCY, URINE: Preg Test, Ur: NEGATIVE

## 2016-04-30 LAB — RAPID URINE DRUG SCREEN, HOSP PERFORMED
Amphetamines: NOT DETECTED
BENZODIAZEPINES: POSITIVE — AB
Barbiturates: NOT DETECTED
COCAINE: POSITIVE — AB
OPIATES: NOT DETECTED
Tetrahydrocannabinol: NOT DETECTED

## 2016-04-30 LAB — SALICYLATE LEVEL: Salicylate Lvl: <7 mg/dL (ref 2.8–30.0)

## 2016-04-30 LAB — ACETAMINOPHEN LEVEL: Acetaminophen (Tylenol), Serum: <10 ug/mL — ABNORMAL LOW (ref 10–30)

## 2016-04-30 LAB — ETHANOL: Alcohol, Ethyl (B): 205 mg/dL — ABNORMAL HIGH (ref <5)

## 2016-04-30 MED ORDER — SODIUM CHLORIDE 0.9 % IV SOLN: INTRAVENOUS | Status: DC

## 2016-04-30 MED ORDER — SODIUM CHLORIDE 0.9 % IV SOLN
INTRAVENOUS | Status: DC
  Administered 2016-04-30: 19:00:00 via INTRAVENOUS

## 2016-04-30 MED ORDER — NALOXONE HCL 2 MG/2ML IJ SOSY
PREFILLED_SYRINGE | INTRAMUSCULAR | Status: AC
  Administered 2016-04-30: 2 mg
  Filled 2016-04-30: qty 2

## 2016-04-30 NOTE — ED Notes (Signed)
Pt transported to CT/xray 
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
## 2016-04-30 NOTE — ED Notes (Signed)
Pt ambulatory to bathroom and back escorted by Tori, CNA- pt able to walk, gait somewhat unsteady, CNA stayed with pt in bathroom to ensure safety.

## 2016-04-30 NOTE — ED Notes (Signed)
Pt vomited large amount, fruity smell, red color with undigested food fragments. Pt less drowsy and more alert after vomiting.

## 2016-04-30 NOTE — ED Notes (Signed)
Pt given gingerale for fluid challenge. 
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
## 2016-05-01 NOTE — ED Provider Notes (Signed)
Pt now has ride home Labs/imaging results reviewed Pt has been ambulatory D/c home   Zadie Rhine, MD 05/01/16 [phone]

## 2016-05-01 NOTE — Discharge Instructions (Signed)
Substance Abuse Treatment Programs ° °Intensive Outpatient Programs °[facility]     °[address]      °[place], [place]                   °[phone]      ° °[facility] °[address] °[place], [place] °[phone] ° °[facility]     °(Inpatient and outpatient)     °[address]           °[phone]   ° °[facility] °336-288-1484 (Suboxone and Methadone) ° °[address]      °[place], [place] [address]      °336-882-2125      ° °[address] °[place], [place] °852-3033 ° °[facility] (Outpatient/Inpatient, Chemical)    °(insurance only) [phone]      °       °[facility] (Groups & Residential) °[place], [place] °336-389-1413 ° °   °[facility]     °[address]     °[place], [place]      °[phone]      ° °[facility] (for caregivers and family) °[address] °[place], [place] °336-299-4655 ° ° ° ° ° °Residential Treatment Programs °[facility]      °[address], [place], [place] [address]  °[phone]      ° °[facility] °[address] [place], [place] [address] °[phone] ° °[facility]        °336-248-8914      ° °[facility] °1-800-659-3381 ° °[facility]             °[address]                                         °[place], [place]                                                °877-615-2722 or 336-784-9470                              ° °[facility] [place] °[address] °[place], [address] °1.877.941.8954 ° °[facility]    °[address]      °[place], [place]     °[phone]      ° °[facility] °[address] °[place], [place] °336-285-9073 ° °[facility]   °[address]     °[place], [place] [address]     °[phone]      °Admissions: 8am-3pm M-F ° °[facility] °[address] °[place],  [place] °336-227-7417 ° °[facility]: Residential Program (90 Days)   °[place], [place]      °336-725-8389 or 800-758-6077    ° °[facility] °[place], [place] °(Walk in Hours over the weekend or by referral) ° °[facility] °[address], [place],  [address] °([phone]) [phone] ° °Crisis Mobile: [facility]:  1-877-626-1772 (for crisis response 24 hours a day) °[facility] Hotline:      1-800-256-2452 °Outpatient Psychiatry and Counseling ° °[facility]: Mobile Crisis   Management 24 hours:  1-877-626-1772 ° °[facility] sliding scale fee and walk in schedule: M-F 8am-12pm/1pm-3pm °[address]  °[place], [place] [address] °[phone] ° °[facility] °[address] °[place], [place] [address] °336-703-9650 ° °[facility] ([facility] known [facility])- new patient walk-in appointments available Monday - Friday 8am -3pm.          °[address] °[place], [place] [address] °336-676-6840 or crisis line- 336-676-6905 ° °[facility] Intensive Outpatient Therapy Program °[address] °[place], [place] [address] °336-832-9804 ° °[facility]                  °Crisis Services      °[phone]      °[address]     °[place], [place] [address]                ° °[facility]   °[facility] °[phone] °[address] °[place], [place] [address] ° ° °[facility]          °[address]  °[place], [place] [address]       °([phone] ° °[facility] °[address] °[place], [place] [address] °336-292-1510 ° °[facility]    °[address]    °[place], [place] [address]     °[phone]      ° °Parish McKinney, MD     °[address]     °[place] [address]     °336-282-1251     °  °[facility] °[address] °[place] [address] ° °[facility]     °[address]     °[place], [place]      °336-542-2076      ° °[facility] °Shamsher Ahluwalia, MD °[address] °[place], [place] [address] °[phone] ° °[facility]     °[address]     °[place], [place] [address]     °336-274-1237      ° °[facility] °[address] °[place], [place] [address] °336-854-4450 °Resources for Temporary Residential Assistance/Crisis Centers ° °DAY CENTERS °[facility] °M-F 8am-3pm   °[address] [place], [place] [address]   336-332-0824 °Services include: laundry, barbering, support groups, case management, phone  & computer access, showers, AA/NA mtgs, mental health/substance abuse nurse, job skills class, disability information, VA assistance, spiritual classes, etc.  ° °HOMELESS SHELTERS ° °[facility]     °[facility]   °[address], [place]     °336.271.5959       °       °[facility] (women and children)       °[address] °, [place] [address] °336-275-0820 °[email or web address] for application and process °Application Required ° °[facility]   °[address]    °[place] [address]     °[phone]       °             °[facility] °[address] °, [place] [address] °336.273.5572 °336-235-0363(schedule application appt.) °Application Required ° °[facility] (women only)    °[address]     °[place],  [address]     °336-884-1039      °  Intake starts 6pm daily °Need valid ID, SSC, & Police report °[facility] [place] °[address] °[place], [place] °336-881-5420 °Application Required ° °[facility] (men only)     °[address]      °[place], [place]     °[phone]      ° °[facility] °(Pregnant women only) °[address] °[place], [place] °336-275-0206 ° °[facility]      °[address]      °[place], [place] [address]     °[phone]      °       °[facility] °[address] °[place], [place] °336-723-1848 °90 day commitment/SA/Application process ° °[facility](men only)     °[address]     °[place], [place]     °[phone]       °Check-in at 7pm     °       °[facility] °[address] °[place], [place] [address] °336-248-6684 °Men/Women/Women and Children must be there by 7 pm ° °[facility] °[place], [place] °336-722-8721                ° °

## 2016-08-11 ENCOUNTER — Emergency Department (HOSPITAL_COMMUNITY)
Admission: EM | Admit: 2016-08-11 | Discharge: 2016-08-11 | Disposition: A | Discharge status: Home / Self Care | Payer: Medicaid Other | Attending: Emergency Medicine | Admitting: Emergency Medicine

## 2016-08-11 ENCOUNTER — Encounter (HOSPITAL_COMMUNITY): Payer: Self-pay | Admitting: *Deleted

## 2016-08-11 DIAGNOSIS — F17210 Nicotine dependence, cigarettes, uncomplicated: Secondary | ICD-10-CM | POA: Insufficient documentation

## 2016-08-11 DIAGNOSIS — H60552 Acute reactive otitis externa, left ear: Secondary | ICD-10-CM | POA: Insufficient documentation

## 2016-08-11 MED ORDER — CIPROFLOXACIN-HYDROCORTISONE 0.2-1 % OT SUSP: 3.0000 [drp] | Freq: Two times a day (BID) | OTIC | 0 refills | Status: DC

## 2016-08-11 NOTE — ED Triage Notes (Signed)
Pt reports she was cleaning out her ear about 1 week ago and the cotton of the qtip fell off into left ear. Pt's family tried to pull it out and ended up pushing it further in. Denies drainage. Pt reports slight decreased hearing in left ear.

## 2016-08-11 NOTE — ED Provider Notes (Signed)
[facility]EMER[facility]Y DEPT Provider Note   CSN: [id number] Arrival date & time: 08/11/16  0756     History   Chief Complaint Chief Complaint  Patient presents with  . Foreign Body in Ear    HPI Candace Gallusshley M Pendell is a 31 y.o. female.  Pt presents to the ED today with sensation of Q tip still in left ear.  Pt said she was cleaning her left ear with a Q tip about 1 week ago.  The tip fell off in her ear.  She's tried to get it out with no success.      Past Medical History:  Diagnosis Date  . Anxiety   . MRSA (methicillin resistant Staphylococcus aureus)   . Staph infection    left breast    Patient Active Problem List   Diagnosis Date Noted  . 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 Smoker 07/17/2012  . Anxiety 07/17/2012    Past Surgical History:  Procedure Laterality Date  . NO PAST SURGERIES      OB History    Gravida Para Term Preterm AB Living   2 2 2     2    SAB TAB Ectopic Multiple Live Births           2       Home Medications    Prior to Admission medications   Medication Sig Start Date End Date Taking? Authorizing Provider  ciprofloxacin-hydrocortisone (CIPRO HC) OTIC suspension Place 3 drops into the left ear 2 (two) times daily. 08/11/16   Jacalyn LefevreHaviland, Alysha Doolan, MD    Family History No family history on file.  Social History Social History  Substance Use Topics  . Smoking status: Current Every Day Smoker    Packs/day: 0.50    Types: Cigarettes  . Smokeless tobacco: Never Used  . Alcohol use No     Allergies   Patient has no known allergies.   Review of Systems Review of Systems  HENT: Positive for ear discharge and ear pain.      Physical Exam Updated Vital Signs BP 111/67 (BP Location: Left Arm)   Pulse 64   Temp 98.6 F (37 C) (Oral)   Resp 18   Ht 5\' 6"  (1.676 m)   Wt 54.4 kg (120 lb)   LMP 08/07/2016   SpO2 97%   BMI 19.37 kg/m   Physical Exam  Constitutional: She appears well-developed and well-nourished.  HENT:    Head: Normocephalic and atraumatic.  Right Ear: External ear normal.  Left Ear: Tympanic membrane and external ear normal. No foreign bodies.  Nose: Nose normal.  Mouth/Throat: Oropharynx is clear and moist.  Left external canal is irritated and swollen.  No evidence of FB.  Eyes: Pupils are equal, round, and reactive to light. Conjunctivae and EOM are normal.  Neck: Normal range of motion. Neck supple.  Cardiovascular: Normal rate, regular rhythm, normal heart sounds and intact distal pulses.   Pulmonary/Chest: Effort normal and breath sounds normal.  Abdominal: Soft. Bowel sounds are normal.  Nursing note and vitals reviewed.    ED Treatments / Results  Labs (all labs ordered are listed, but only abnormal results are displayed) Labs Reviewed - No data to display  EKG  EKG Interpretation None       Radiology No results found.  Procedures Procedures (including critical care time)  Medications Ordered in ED Medications - No data to display   Initial Impression / Assessment and Plan / ED Course  I have reviewed the  triage vital signs and the nursing notes.  Pertinent labs & imaging results that were available during my care of the patient were reviewed by me and considered in my medical decision making (see chart for details).     Pt's sx are likely due to attempts to remove Q tip.  Q tip is no longer in ear.  Pt knows to return if worse and f/u with pcp.  Final Clinical Impressions(s) / ED Diagnoses   Final diagnoses:  Acute reactive otitis externa of left ear    New Prescriptions New Prescriptions   CIPROFLOXACIN-HYDROCORTISONE (CIPRO HC) OTIC SUSPENSION    Place 3 drops into the left ear 2 (two) times daily.     Jacalyn Lefevre, MD 08/11/16 [phone]

## 2016-11-25 ENCOUNTER — Emergency Department (HOSPITAL_COMMUNITY)
Admission: EM | Admit: 2016-11-25 | Discharge: 2016-11-25 | Disposition: A | Discharge status: Home / Self Care | Payer: No Typology Code available for payment source | Attending: Emergency Medicine | Admitting: Emergency Medicine

## 2016-11-25 ENCOUNTER — Encounter (HOSPITAL_COMMUNITY): Payer: Self-pay | Admitting: Emergency Medicine

## 2016-11-25 DIAGNOSIS — N6310 Unspecified lump in the right breast, unspecified quadrant: Secondary | ICD-10-CM | POA: Diagnosis present

## 2016-11-25 DIAGNOSIS — F17210 Nicotine dependence, cigarettes, uncomplicated: Secondary | ICD-10-CM | POA: Diagnosis not present

## 2016-11-25 DIAGNOSIS — N644 Mastodynia: Secondary | ICD-10-CM | POA: Diagnosis not present

## 2016-11-25 MED ORDER — TRAMADOL HCL 50 MG PO TABS: 50.0000 mg | ORAL_TABLET | Freq: Four times a day (QID) | ORAL | 0 refills | Status: DC | PRN

## 2016-11-25 MED ORDER — IBUPROFEN 600 MG PO TABS: 600.0000 mg | ORAL_TABLET | Freq: Four times a day (QID) | ORAL | 0 refills | Status: DC

## 2016-11-25 NOTE — Discharge Instructions (Signed)
Your temperature, pulse, and blood pressure are all within normal limits.  Your examination does not show evidence of drainage from your right breast, neither is there any hot or red areas appreciated.  It is quite possible that your pain is hormonal related, or related to your menstrual cycles.  Please use 600 mg of ibuprofen with breakfast, lunch, dinner, and at bedtime.  May use Ultram for more severe pain.  Please see your physicians at the health department for additional evaluation of your breast if not improving.

## 2016-11-25 NOTE — ED Provider Notes (Signed)
[facility] EMERGENCY DEPARTMENT Provider Note   CSN: [id number] Arrival date & time: 11/25/16  1109     History   Chief Complaint Chief Complaint  Patient presents with  . Breast Mass    HPI Misty Reynolds is a 31 y.o. female.  Patient is a 31 year old female who presents to the emergency department with a complaint of right breast pain.  The patient states that 3 days ago she noticed a knot on her right breast.  Following that she noted pain from the breasts into the shoulder and down her arm.  She feels that her right hand was more red than usual on last evening.  She had pain last evening with movement of her arm and shoulder.  The patient denies any drainage or discharge from the breast.  She states she has not had any injury to the breast.  She has not had any human bite to the breast or insect or anything to bite her.  No recent operations or procedures on the chest or breast area.  It is of note that approximately a year ago or more she had a staph infection involving the left breast.  At that time she had drainage from the nipple area.  She has not had any fever or chills to be reported.  She presents now for assistance with this issue.      Past Medical History:  Diagnosis Date  . Anxiety   . MRSA (methicillin resistant Staphylococcus aureus)   . Staph infection    left breast    Patient Active Problem List   Diagnosis Date Noted  . 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 Smoker 07/17/2012  . Anxiety 07/17/2012    Past Surgical History:  Procedure Laterality Date  . NO PAST SURGERIES      OB History    Gravida Para Term Preterm AB Living   2 2 2     2    SAB TAB Ectopic Multiple Live Births           2       Home Medications    Prior to Admission medications   Medication Sig Start Date End Date Taking? Authorizing Provider  ciprofloxacin-hydrocortisone (CIPRO HC) OTIC suspension Place 3 drops into the left ear 2 (two) times daily. 08/11/16    Jacalyn Lefevre, MD    Family History History reviewed. No pertinent family history.  Social History Social History  Substance Use Topics  . Smoking status: Current Every Day Smoker    Packs/day: 0.50    Types: Cigarettes  . Smokeless tobacco: Never Used  . Alcohol use No     Allergies   Patient has no known allergies.   Review of Systems Review of Systems  Constitutional: Negative for activity change.       All ROS Neg except as noted in HPI  HENT: Negative for nosebleeds.   Eyes: Negative for photophobia and discharge.  Respiratory: Negative for cough, shortness of breath and wheezing.   Cardiovascular: Negative for chest pain and palpitations.  Gastrointestinal: Negative for abdominal pain and blood in stool.  Genitourinary: Negative for dysuria, frequency and hematuria.  Musculoskeletal: Negative for arthralgias, back pain and neck pain.  Skin: Negative.   Neurological: Negative for dizziness, seizures and speech difficulty.  Psychiatric/Behavioral: Negative for confusion and hallucinations. The patient is nervous/anxious.      Physical Exam Updated Vital Signs BP 109/65 (BP Location: Right Arm)   Pulse 79   Temp 97.9 F (  36.6 C) (Oral)   Resp 16   Ht 5\' 6"  (1.676 m)   Wt 63.5 kg (140 lb)   LMP 11/22/2016   SpO2 100%   BMI 22.60 kg/m   Physical Exam  Constitutional: She is oriented to person, place, and time. She appears well-developed and well-nourished.  Non-toxic appearance.  HENT:  Head: Normocephalic.  Right Ear: Tympanic membrane and external ear normal.  Left Ear: Tympanic membrane and external ear normal.  Eyes: Pupils are equal, round, and reactive to light. EOM and lids are normal.  Neck: Normal range of motion. Neck supple. Carotid bruit is not present.  Cardiovascular: Normal rate, regular rhythm, normal heart sounds, intact distal pulses and normal pulses.   Pulmonary/Chest: Breath sounds normal. No respiratory distress.    Chaperone  present during the examination.  The right and left breasts appear symmetrical.  There is no drainage from the right nipple area.  There are no hot areas appreciated.  No red streaks noted.  There is question of a small cysts at the 10 o'clock position of the right breast.  No other mass appreciated.  Abdominal: Soft. Bowel sounds are normal. There is no tenderness. There is no guarding.  Musculoskeletal: Normal range of motion.  No palpable nodes of the bicep tricep area.  There is full range of motion of the fingers, wrists, and shoulder of the right upper extremity.  There is soreness with range of motion of the shoulder.  Lymphadenopathy:       Head (right side): No submandibular adenopathy present.       Head (left side): No submandibular adenopathy present.    She has no cervical adenopathy.  Neurological: She is alert and oriented to person, place, and time. She has normal strength. No cranial nerve deficit or sensory deficit.  Skin: Skin is warm and dry.  Psychiatric: Her speech is normal. Her mood appears anxious.  Nursing note and vitals reviewed.    ED Treatments / Results  Labs (all labs ordered are listed, but only abnormal results are displayed) Labs Reviewed - No data to display  EKG  EKG Interpretation None       Radiology No results found.  Procedures Procedures (including critical care time)  Medications Ordered in ED Medications - No data to display   Initial Impression / Assessment and Plan / ED Course  I have reviewed the triage vital signs and the nursing notes.  Pertinent labs & imaging results that were available during my care of the patient were reviewed by me and considered in my medical decision making (see chart for details).      Final Clinical Impressions(s) / ED Diagnoses MDM Vital signs are well within normal limits.  There is no drainage from the nipple areas.  There is no red streaks appreciated.  There is no palpable or visible  abscess.  There are no lymph nodes involving the axilla or the bicep tricep area.  I discussed with the patient that this may be more related to hormones, and/or her menstrual cycle instead of an infection.  I have treated the patient with anti-inflammatory pain medication as well as Ultram.  I have asked her to see her physicians at the health department for additional evaluation concerning her breast pain and the possible need of breast ultrasound and normal amount of mammography.  Patient is in agreement with this plan.   Final diagnoses:  Breast pain, right    New Prescriptions Discharge Medication List as of 11/25/2016  2:11 PM    START taking these medications   Details  ibuprofen (ADVIL,MOTRIN) 600 MG tablet Take 1 tablet (600 mg total) by mouth 4 (four) times daily., Starting Fri 11/25/2016, Print         Ivery Quale, PA-C 11/25/16 1423    Samuel Jester, DO 11/29/16 1547

## 2016-11-25 NOTE — ED Triage Notes (Signed)
Patient complaining of right breast "knot" x 3 days. No redness or swelling noted at triage. Complaining of pain to that area.

## 2016-12-04 ENCOUNTER — Encounter (HOSPITAL_COMMUNITY): Payer: Self-pay | Admitting: *Deleted

## 2016-12-04 ENCOUNTER — Emergency Department (HOSPITAL_COMMUNITY)
Admission: EM | Admit: 2016-12-04 | Discharge: 2016-12-04 | Disposition: A | Discharge status: Home / Self Care | Payer: Self-pay | Attending: Emergency Medicine | Admitting: Emergency Medicine

## 2016-12-04 DIAGNOSIS — F17210 Nicotine dependence, cigarettes, uncomplicated: Secondary | ICD-10-CM | POA: Insufficient documentation

## 2016-12-04 DIAGNOSIS — J069 Acute upper respiratory infection, unspecified: Secondary | ICD-10-CM | POA: Insufficient documentation

## 2016-12-04 DIAGNOSIS — Z79899 Other long term (current) drug therapy: Secondary | ICD-10-CM | POA: Insufficient documentation

## 2016-12-04 DIAGNOSIS — R1110 Vomiting, unspecified: Secondary | ICD-10-CM | POA: Insufficient documentation

## 2016-12-04 MED ORDER — ALBUTEROL SULFATE HFA 108 (90 BASE) MCG/ACT IN AERS
2.0000 | INHALATION_SPRAY | RESPIRATORY_TRACT | Status: DC | PRN
  Administered 2016-12-04: 2 via RESPIRATORY_TRACT
  Filled 2016-12-04: qty 6.7

## 2016-12-04 MED ORDER — AEROCHAMBER PLUS FLO-VU MEDIUM MISC
1.0000 | Freq: Once | Status: AC
  Administered 2016-12-04: 1
  Filled 2016-12-04 (×2): qty 1

## 2016-12-04 MED ORDER — ONDANSETRON 8 MG PO TBDP
8.0000 mg | ORAL_TABLET | Freq: Once | ORAL | Status: AC
  Administered 2016-12-04: 8 mg via ORAL
  Filled 2016-12-04: qty 1

## 2016-12-04 NOTE — ED Triage Notes (Signed)
Pt c/o cough, chest tenderness from coughing, diarrhea and emesis for past 2 days.

## 2016-12-19 NOTE — ED Provider Notes (Signed)
[facility] EMERGENCY DEPARTMENT Provider Note   CSN: [id number] Arrival date & time: 12/04/16  2043     History   Chief Complaint Chief Complaint  Patient presents with  . Emesis    HPI Misty Reynolds is a 31 y.o. female.  HPI  31 year old female presents today complaining of cough, temp to 101, headache, myalgias, diarrhea, and some emesis for the past 2 days.  She complains of pain with coughing.  She smokes 2 cigarettes a day.  She states that she feels that she needs an inhaler.  She vomited 2-3 times per day today with clear liquid.  She had 2 hamburgers and states that she has not had much to eat.  She has drank clear liquids.  She denies any increase or decreased urine output.  She states her menstrual cycles are normal and she is not pregnant.  Past Medical History:  Diagnosis Date  . Anxiety   . MRSA (methicillin resistant Staphylococcus aureus)   . Staph infection    left breast    Patient Active Problem List   Diagnosis Date Noted  . 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 Smoker 07/17/2012  . Anxiety 07/17/2012    Past Surgical History:  Procedure Laterality Date  . NO PAST SURGERIES      OB History    Gravida Para Term Preterm AB Living   2 2 2     2    SAB TAB Ectopic Multiple Live Births           2       Home Medications    Prior to Admission medications   Medication Sig Start Date End Date Taking? Authorizing Provider  ciprofloxacin-hydrocortisone (CIPRO HC) OTIC suspension Place 3 drops into the left ear 2 (two) times daily. 08/11/16   Jacalyn LefevreHaviland, Julie, MD  ibuprofen (ADVIL,MOTRIN) 600 MG tablet Take 1 tablet (600 mg total) by mouth 4 (four) times daily. 11/25/16   Ivery QualeBryant, Hobson, PA-C  traMADol (ULTRAM) 50 MG tablet Take 1 tablet (50 mg total) by mouth every 6 (six) hours as needed. 11/25/16   Ivery QualeBryant, Hobson, PA-C    Family History History reviewed. No pertinent family history.  Social History Social History   Tobacco Use  .  Smoking status: Current Every Day Smoker    Packs/day: 0.50    Types: Cigarettes  . Smokeless tobacco: Never Used  Substance Use Topics  . Alcohol use: No  . Drug use: No    Comment: Urine pos for oxycodone in October.     Allergies   Patient has no known allergies.   Review of Systems Review of Systems  All other systems reviewed and are negative.    Physical Exam Updated Vital Signs BP (!) 128/96 (BP Location: Right Arm)   Pulse 80   Temp 98.1 F (36.7 C) (Oral)   Resp 17   Ht 1.676 m (5\' 6" )   Wt 60.7 kg (133 lb 14.4 oz)   LMP 11/22/2016   SpO2 98%   BMI 21.61 kg/m   Physical Exam  Constitutional: She appears well-developed and well-nourished. No distress.  HENT:  Head: Normocephalic and atraumatic.  Right Ear: External ear normal.  Left Ear: External ear normal.  Nose: Nose normal.  Mouth/Throat: Oropharynx is clear and moist.  Eyes: EOM are normal. Pupils are equal, round, and reactive to light.  Neck: Normal range of motion. Neck supple.  Cardiovascular: Normal rate, regular rhythm, normal heart sounds and intact distal pulses.  Pulmonary/Chest:  Effort normal and breath sounds normal.  Abdominal: Soft. Bowel sounds are normal.  Musculoskeletal: Normal range of motion.  Multiple contusions lower extremity no bony point tenderness  Neurological: She is alert.  Skin: Skin is warm. Capillary refill takes less than 2 seconds.  Psychiatric: She has a normal mood and affect.  Nursing note and vitals reviewed.    ED Treatments / Results  Labs (all labs ordered are listed, but only abnormal results are displayed) Labs Reviewed - No data to display  EKG  EKG Interpretation None       Radiology No results found.  Procedures Procedures (including critical care time)  Medications Ordered in ED Medications  AEROCHAMBER PLUS FLO-VU MEDIUM MISC 1 each (1 each Other Given 12/04/16 2209)  ondansetron (ZOFRAN-ODT) disintegrating tablet 8 mg (8 mg Oral  Given 12/04/16 2213)     Initial Impression / Assessment and Plan / ED Course  I have reviewed the triage vital signs and the nursing notes.  Pertinent labs & imaging results that were available during my care of the patient were reviewed by me and considered in my medical decision making (see chart for details).     Is well-appearing here with normal vital signs and clear lungs and normal exam.  She is given albuterol HFA states this is helped before with coughing.  She is advised regarding smoking cessation.  She requests a work note for today and is given 1. Final Clinical Impressions(s) / ED Diagnoses   Final diagnoses:  Upper respiratory tract infection, unspecified type    ED Discharge Orders    None    Addendum this note was done at the time of service.  However due to epic upgrade it was not able to be completed.  After IT assistance, I was able to copy into new note and sign   Margarita Grizzleay, Jameka Ivie, MD 12/19/16 [phone]

## 2017-11-01 ENCOUNTER — Ambulatory Visit: Admitting: Nurse Practitioner

## 2017-11-01 ENCOUNTER — Ambulatory Visit: Admitting: Internal Medicine

## 2017-11-01 NOTE — Progress Notes (Signed)
* * *        **  Jaime Perez**    ------    60 Y old Female, DOB: [birth year]    [address] , [place], [place], [place] [address]    Home: [phone]    Provider: Sharyn Lull        * * *    Telephone Encounter    ---    Answered by    Barry Dienes    Date: 11/01/2017        Time: 11:15 AM    Reason    womens health appts/ referral    ------            Message                      pt needs referral to see women's health      dr. Shellia Cleverly: [phone]      Address: [address] [place], [place] [address]      ph: [phone]      fax: [phone]      scheduled for: 11/21/17 @10 :00am ultra sound                             12/11/17 @1 :20pm new OB appt                            01/08/18 @2 :20 for 7 week appt                Action Taken                      pt informed       Greenblatt,Angel  11/01/2017 1:55:06 PM >      referral [id number]       v: 6      exp: 11/22/18      faxed      Greenblatt,Angel  11/02/2017 3:11:58 PM >                     * * *                ---          * * *          Patient: Jaime Perez DOB: 1985-08-27 Provider: Sharyn Lull 11/01/2017    ---    Note generated by eClinicalWorks EMR/PM Software ([email or web address])

## 2017-11-01 NOTE — Progress Notes (Signed)
** Progress Notes  **    ---    **Patient:** Jaime Perez, Jaime Perez    **Account Number:** [id number]    **Provider:** Barry Dienes, APRN    **DOB:** Sep 16, 1985  **Age:** 62 Y  **Sex:** Female    **Date:** 11/01/2017    **Phone:** [phone]    **Address:** [address] , [place], [id number]    **Pcp:** Sharyn Lull        * * *        **Subjective:**        ---      **Chief Complaints:**    ------      1\. NP /PE.    ------      **HPI:**    _Depression Screening_ :    PHQ-9 Little interest or pleasure in doing things Not at all, Feeling down,  depressed, or hopeless Not at all, Trouble falling or staying asleep, or  sleeping too much Nearly every day, Feeling tired or having little energy Not  at all, Poor appetite or overeating Not at all, Feeling bad about yourself-or  that you are a failure or have let yourself or your family down Not at all,  Trouble concentrating on things, such as reading the newspaper or watching  television More than half the days, Moving or speaking so slowly that other  people could have noticed. Or the opposite- being so fidgety or restless that  you have been moving around a lot more than usual Not at all, Thoughts that  you would be better off dead, or of hurting yourself in some way? Not at all,  Total Score: 5, Interpretation Mild Depression.    ------      **ROS:**    _CONSTITUTIONAL_ :    Appetite  normal  . Chills  none  . Energy level  normal  . Fatigue  none  .  Fever  none  . Weakness  no  . Weight change  none  .    _HEENT_ :    no  Change in vision.  no  Loss of hearing.  no  Trouble swallowing.    _RESPIRATORY_ :    Shortness of breath  none  . Cough  none  . Wheezing  none  .    _CARDIOLOGY_ :    Chest pain  none  . Dyspnea on exertion  none  . Palpitations  none  .  Swelling of ankles  None  .    _GASTROENTEROLOGY_ :    Abdominal pain  none  . Acid reflux  none  . Blood in stool  none  . Change in  bowel habits  none  . Constipation  none  . Difficulty swallowing   not noted  . Heartburn  none  . Nausea  none  . Vomiting  none  .    _UROLOGY_ :    no  Blood in urine.  no  Burning on urination.  no  Difficulty urinating.  no  Urinary frequency.  no  Urinary incontinence.    _FEMALE REPRODUCTIVE_ :    Patient denies  abnormal vaginal bleeding  .  no  Abnormal vaginal discharge.  no  Breast lumps or discharge.  no  Breast pain.    _MUSCULOSKELETAL_ :    Joint pain  none  . Joint stiffness  none  .    _NEUROLOGY_ :    no  Dizziness.  no  Seizures.  no  Tingling/numbness.  no  Weakness.  _PSYCHOLOGY_ :    no  Anxiety.  no  Depression. Insomnia  none  . Memory loss  none  .    _HEMATOLOGY/LYMPH_ :    no  Easy bleeding.  no  Easy bruising.  no  Swollen glands.  no  Varicose  veins.    _DERMATOLOGY_ :    Rash  none  .  no  Suspicious lesions.    _ALLERGY_ :    Patient denies  allergic reaction  .        ------      **Medical History:** Anxiety/depression, Insomnia.        ------      **Family History:** Father: alive. Mother: alive, diagnosed with  Hypertension. 3 brother(s) , 2 sister(s) . 2 son(s) . Marland Kitchen    No hx of cva or DM. Paternal grandfather with lymphoma.    ------      **Social History:**    _General:_ Smoking  Patient is a: current tobacco user pt. stated that she  smokes about 4 ciggerettes a day. Pt. stated that she started at age 74.Marland Kitchen  Alcohol Screening  Did you have a drink containing alcohol in the past year?  No  , Points 0  , Interpretation Negative.    ------      **Medications:** Not-Taking Trazodone HCl 150 MG Tablet 1 tablet at bedtime  Orally Once a day, Medication List reviewed and reconciled with the patient    ------      **Allergies:** N.K.D.A.    ------        **Objective:**        ---      **Vitals:** Counseling 1, Wt 131.6, Ht 66, BMI 21.24, BP 120/78, HR 76,  Temp 98.1, Smoking yes, SaO2 98    no alcohol.    ------      **Physical Examination:**    _GENERAL_ :    General Appearance:  alert and oriented  ,  well-appearing  ,   well-nourished  .    _LUNGS_ :    Auscultation:  CTA bilaterally  .    _HEENT_ :    Ear canals:  normal  . Ears:  tympanic membranes normal bilaterally  . EOM:  within normal limits  . Head:  normocephalic  . Mouth:  no lesions  . Nose:  unremarkable  . Oral cavity:  normal  . Pharynx:  normal  .    _HEART_ :    Distal Pulses Palpable:  yes  . Edema:  none visible  . Heart sounds:  normal  S1S2  . Murmurs:  none  . Rate:  regular  . Rhythm:  regular  .    _CHEST_ :    chest wall:  non-tender  .    _ABDOMEN_ :    Bowel sounds:  normal  . General:  soft  . Hernia:  absent  . Liver, Spleen:  non-enlarged  . Masses:  no  . Tenderness:  absent  .    _GENITOURINARY - FEMALE_ :    Exam deferred  per patient's preference  .    _EXTREMITIES_ :    Clubbing:  none  . Cyanosis:  no  . Edema:  none  . Pulses:  2+ bilateral  .    _BACK_ :    Spine:  normal spine curvature  ,  no tenderness to palpation  .    _MUSCULOSKELETAL_ :    Lower extremity joints:  normal  . Upper extremity joints:  normal  .    _LYMPHATICS_ :    Axillary:  none  . Inguinal:  none  .    _NEUROLOGICAL_ :    Grossly normal:  yes  . Motor:  normal strength  . Reflexes:  normal  .  Sensory:  normal sensation  .    _PSYCHOLOGY_ :    Affect:  normal  .    _SKIN_ :    Moles:  none  . Skin Lesion(s):  none  .    _NECK_ :    Carotid bruit:  none  . Neck  supple  . Thyroid:  no thyromegaly  .        ------        **Assessment:**        ---      **Assessment:**        1\. Encounter for general adult medical examination without abnormal findings  - Z00.00 (Primary)    2\. Mixed hyperlipidemia - E78.2    3\. Encounter for pregnancy test, result unknown - Z32.00    ------        **Plan:**        ---        **1\. Encounter for general adult medical examination without abnormal  findings**    _LAB: CBC (INCLUDES DIFF/PLT) (Ordered for 11/01/2017)_    _LAB: COMPREHENSIVE METABOLIC PANEL W/EGFR (Ordered for 11/01/2017)_    _LAB: LIPID PANEL* (Ordered for 11/01/2017)_     _LAB: TSH, 3RD GENERATION (Ordered for 11/01/2017)_    Notes:  within normal limit  .    ---    **2\. Mixed hyperlipidemia**    _LAB: CBC (INCLUDES DIFF/PLT) (Ordered for 11/01/2017)_    _LAB: COMPREHENSIVE METABOLIC PANEL W/EGFR (Ordered for 11/01/2017)_    _LAB: LIPID PANEL* (Ordered for 11/01/2017)_    _LAB: TSH, 3RD GENERATION (Ordered for 11/01/2017)_    Notes: labs pending.    **3\. Encounter for pregnancy test, result unknown**    Notes:  positive test pt will be seeing OB GYN women's health appt pending.  .      **Labs:**    ------    _Lab: [facility]- Urine - pregnancy test_    ---      Result    \+        ------------        Cripps,Justin 11/01/2017 11:12:56 AM >    ------      **Procedure Codes:** 1159F MED LIST DOCD IN [facility], 1160F RVW MEDS BY RX/DR  IN [facility], [address] URINE PREGNANCY TEST    ------      **Preventive:**    Counseling: Alcohol and drugs . Skin Exam Patient advised to check moles for  any asymmetry, irregular border, multiple color, bigger than 6 mm or new  developing mole on body. Breast self exam after periods . Exercise . Seatbelts  . Sexual practices . Sunscreen .    ------      **Follow Up:** 1 Year    ------          **Provider:** Barry Dienes, APRN    ---    **Patient:** Jaime Perez **DOB:** September 02, 1985 **Date:** 11/01/2017    ---    Electronically signed by Barry Dienes , APRN on 11/05/2017 at 10:09 PM EDT    Sign off status: Completed

## 2017-11-21 ENCOUNTER — Ambulatory Visit: Admitting: Family

## 2017-11-21 ENCOUNTER — Ambulatory Visit

## 2017-11-21 LAB — OB RESULTS CONSOLE RPR: RPR: NONREACTIVE

## 2017-11-21 LAB — OB RESULTS CONSOLE RUBELLA ANTIBODY, IGM: RUBELLA: IMMUNE

## 2017-11-21 LAB — OB RESULTS CONSOLE GC/CHLAMYDIA
Chlamydia: NEGATIVE
GC PROBE AMP, GENITAL: NEGATIVE

## 2017-11-21 LAB — OB RESULTS CONSOLE ANTIBODY SCREEN: ANTIBODY SCREEN: NEGATIVE

## 2017-11-21 LAB — OB RESULTS CONSOLE HIV ANTIBODY (ROUTINE TESTING): HIV: NONREACTIVE

## 2017-11-21 LAB — OB RESULTS CONSOLE HEPATITIS B SURFACE ANTIGEN: HEP B S AG: NEGATIVE

## 2017-11-21 LAB — HX CBC W/ INDICES
CASE NUMBER: 2019295001878
HX ABSOLUTE NRBC COUNT: 0 10*3/uL
HX HCT: 39.7 % — NL (ref 36.0–47.0)
HX HGB: 13.5 g/dL — NL (ref 11.8–16.0)
HX MCH: 30.6 pg — NL (ref 26.0–34.0)
HX MCHC: 34 g/dL — NL (ref 31.0–37.0)
HX MCV: 90 fL — NL (ref 80.0–100.0)
HX MPV: 11.1 fL — NL (ref 9.4–12.4)
HX NRBC PERCENT: 0 % — NL
HX PLATELET: 184 10*3/uL — NL (ref 150.0–400.0)
HX RBC: 4.41 10*6/uL — NL (ref 3.9–5.2)
HX RDW-CV: 12.3 % — NL (ref 11.5–14.5)
HX RDW-SD: 40 fL — NL (ref 35.0–51.0)
HX WBC: 8 10*3/uL — NL (ref 3.7–11.2)

## 2017-11-21 LAB — HX HEPATITIS C ANTIBODY
CASE NUMBER: 2019295001878
HX HEP C AB INST: REACTIVE — AB
HX HEP C AB MEASURED: >11 — ABNORMAL HIGH (ref 0.0–0.8)
HX HEP C AB: REACTIVE — AB

## 2017-11-21 LAB — HX HEPATITIS B SURFACE ANTIGEN PRENATAL
CASE NUMBER: 2019295001878
HX HBSAG MEASURED: <0.1 — NL (ref 0.0–0.99)
HX HEP B SURFACE AG PRENATAL: NEGATIVE
HX HEP BS AG INST: NONREACTIVE

## 2017-11-21 LAB — HX URINALYSIS (COMPLETE)
CASE NUMBER: 2019295002599
HX UA BILIRUBIN: NEGATIVE — NL
HX UA BLOOD: NEGATIVE — NL
HX UA GLUCOSE: NEGATIVE — NL
HX UA KETONES: NEGATIVE — NL
HX UA LEUKOCYTE ESTERASE: 75 WBC/uL — AB
HX UA NITRITE: NEGATIVE — NL
HX UA PH: 7 — NL (ref 5.0–8.0)
HX UA PROTEIN: NEGATIVE — NL
HX UA RBC: 1 /HPF — NL (ref 0.0–2.0)
HX UA SPECIFIC GRAVITY: 1.006 — NL (ref 1.003–1.03)
HX UA SQUAMOUS EPITHELIAL: <1 — NL (ref 0.0–5.0)
HX UA UROBILINOGEN: NEGATIVE — NL
HX UA WBC: <1 — NL (ref 0.0–5.0)

## 2017-11-21 LAB — HX .MDPH REPORTABLES
CASE NUMBER: 2019295001878
HX MDPH HEPBSAG PRENATAL: NEGATIVE
HX MDPH HEPCAB: POSITIVE

## 2017-11-21 LAB — HX .MYRIAD KIT DRAW
CASE NUMBER: 2019295001886
HX FORESIGHT KIT: 11004512600000

## 2017-11-21 LAB — HX HIV 1/2 ANTIGEN/ANTIBODY COMBINATION ASS
CASE NUMBER: 2019295001879
HX HIV-1/HIV-2 AG/AB COMBO SCREEN: NONREACTIVE

## 2017-11-21 LAB — HX RUBELLA ANTIBODY IGG
CASE NUMBER: 2019295001878
HX RUBELLA IGG: 98.2 [IU]/mL

## 2017-11-22 LAB — HX SEXUALLY TRAN DIS (AMP PRB)
CASE NUMBER: 2019295001982
HX C TRACHOMATIS DNA: NOT DETECTED — NL
HX N. GONORRHOEAE DNA: NOT DETECTED — NL
HX TOTAL RLU: 12

## 2017-11-22 LAB — HX ABO/RH TYPE
CASE NUMBER: 2019295001878
HX ABO/RH TYPE: O POS — NL

## 2017-11-22 LAB — HX URINE CULTURE
CASE NUMBER: 2019295002600
HX F: 60000

## 2017-11-22 LAB — HX ANTIBODY SCREEN
CASE NUMBER: 2019295001878
HX ANTIBODY SCREEN AUTOMATED: NEGATIVE — NL

## 2017-11-22 LAB — HX RPR
CASE NUMBER: 2019295001878
HX RPR QUAL: NONREACTIVE — NL

## 2017-11-23 LAB — CYSTIC FIBROSIS DIAGNOSTIC STUDY: Interpretation-CFDNA:: NEGATIVE

## 2017-11-23 LAB — HX GYN FINAL REPORT
CASE NUMBER: 0
HX FINAL CYTOLOGIC INTERPRETATION: NEGATIVE

## 2017-11-23 LAB — HX HPV HIGH RISK BY TMA
CASE NUMBER: 2019295001982
HX HPV HIGH RISK BY TMA: NOT DETECTED

## 2017-11-25 LAB — HX HEMOGLOBIN EVALUATION
CASE NUMBER: 2019295001878
HX HGB A1: 97.4 %
HX HGB A2: 2.6 %
HX HGB F: 0 %

## 2017-11-27 ENCOUNTER — Ambulatory Visit

## 2017-12-11 ENCOUNTER — Ambulatory Visit: Admitting: Specialist

## 2017-12-14 ENCOUNTER — Ambulatory Visit: Admitting: Specialist

## 2017-12-25 ENCOUNTER — Ambulatory Visit: Admitting: Obstetrics & Gynecology

## 2017-12-25 LAB — HX HEPATIC FUNCTION PANEL
CASE NUMBER: 2019329001748
HX ALBUMIN LVL: 3.7 g/dL — NL (ref 3.2–5.0)
HX ALKALINE PHOSPHATASE: 46 U/L — NL (ref 30.0–117.0)
HX ALT: 36 U/L — NL (ref 6.0–55.0)
HX AST: 20 U/L — NL (ref 6.0–40.0)
HX BILIRUBIN DIRECT: 0.1 mg/dL — NL (ref 0.0–0.3)
HX BILIRUBIN TOTAL: 0.4 mg/dL — NL (ref 0.2–1.2)
HX TOTAL PROTEIN: 7.6 g/dL — NL (ref 6.0–8.4)

## 2017-12-28 LAB — HX HEPATITIS C GENOTYPING: CASE NUMBER: 2019329001748

## 2017-12-29 LAB — HX ZZHEPATITIS C VIRUS RNA QUAL
CASE NUMBER: 2019329001748
HX HCV RNA QUAL: DETECTED — AB

## 2018-01-02 ENCOUNTER — Encounter: Payer: Self-pay | Admitting: *Deleted

## 2018-01-02 ENCOUNTER — Other Ambulatory Visit: Payer: Self-pay | Admitting: *Deleted

## 2018-01-02 DIAGNOSIS — Z3490 Encounter for supervision of normal pregnancy, unspecified, unspecified trimester: Secondary | ICD-10-CM | POA: Insufficient documentation

## 2018-01-02 DIAGNOSIS — Z3402 Encounter for supervision of normal first pregnancy, second trimester: Secondary | ICD-10-CM

## 2018-01-19 ENCOUNTER — Encounter: Payer: Self-pay | Admitting: Women's Health

## 2018-01-19 ENCOUNTER — Ambulatory Visit: Payer: Self-pay | Admitting: *Deleted

## 2018-01-29 ENCOUNTER — Ambulatory Visit

## 2018-02-08 ENCOUNTER — Ambulatory Visit (INDEPENDENT_AMBULATORY_CARE_PROVIDER_SITE_OTHER): Payer: Medicaid Other | Admitting: Women's Health

## 2018-02-08 ENCOUNTER — Ambulatory Visit: Payer: Self-pay | Admitting: *Deleted

## 2018-02-08 ENCOUNTER — Encounter: Payer: Self-pay | Admitting: Women's Health

## 2018-02-08 ENCOUNTER — Other Ambulatory Visit: Payer: Self-pay

## 2018-02-08 VITALS — BP 101/63 | HR 85 | Wt 149.0 lb

## 2018-02-08 DIAGNOSIS — Z1379 Encounter for other screening for genetic and chromosomal anomalies: Secondary | ICD-10-CM

## 2018-02-08 DIAGNOSIS — O98412 Viral hepatitis complicating pregnancy, second trimester: Secondary | ICD-10-CM | POA: Diagnosis not present

## 2018-02-08 DIAGNOSIS — B182 Chronic viral hepatitis C: Secondary | ICD-10-CM

## 2018-02-08 DIAGNOSIS — Z331 Pregnant state, incidental: Secondary | ICD-10-CM

## 2018-02-08 DIAGNOSIS — Z1389 Encounter for screening for other disorder: Secondary | ICD-10-CM

## 2018-02-08 DIAGNOSIS — O99332 Smoking (tobacco) complicating pregnancy, second trimester: Secondary | ICD-10-CM | POA: Diagnosis not present

## 2018-02-08 DIAGNOSIS — F17200 Nicotine dependence, unspecified, uncomplicated: Secondary | ICD-10-CM

## 2018-02-08 DIAGNOSIS — O99342 Other mental disorders complicating pregnancy, second trimester: Secondary | ICD-10-CM | POA: Diagnosis not present

## 2018-02-08 DIAGNOSIS — F17210 Nicotine dependence, cigarettes, uncomplicated: Secondary | ICD-10-CM

## 2018-02-08 DIAGNOSIS — F418 Other specified anxiety disorders: Secondary | ICD-10-CM

## 2018-02-08 DIAGNOSIS — Z3A19 19 weeks gestation of pregnancy: Secondary | ICD-10-CM

## 2018-02-08 DIAGNOSIS — A599 Trichomoniasis, unspecified: Secondary | ICD-10-CM

## 2018-02-08 DIAGNOSIS — Z3402 Encounter for supervision of normal first pregnancy, second trimester: Secondary | ICD-10-CM

## 2018-02-08 DIAGNOSIS — Z3482 Encounter for supervision of other normal pregnancy, second trimester: Secondary | ICD-10-CM

## 2018-02-08 DIAGNOSIS — Z363 Encounter for antenatal screening for malformations: Secondary | ICD-10-CM

## 2018-02-08 NOTE — Progress Notes (Signed)
INITIAL OBSTETRICAL VISIT Patient name: Misty Reynolds MRN [id number]  Date of birth: October 24, 1985 Chief Complaint:   Initial Prenatal Visit (transfer care 19wk)  History of Present Illness:   Misty HEINSOHN is a 33 y.o. G85P2002 Caucasian female at [redacted]w[redacted]d by 8wk u/s, with an Estimated Date of Delivery: 06/30/18 being seen today for her initial obstetrical visit, with [place].  She is transferring from [place] d/t recent move. Partner wants paternity testing.  Her obstetrical history is significant for term SVB x 2.   H/O dep/anx/panic attacks- no meds, doing well Dx w/ HepC at new ob visit in [place], denies h/o IVDU, etc Smoker 1/2ppd now down to 1pk q 3-4d Today she reports no complaints.  No LMP recorded. Patient is pregnant. Last pap 11/21/17. Results were: neg w/ -HRHPV Review of Systems:   Pertinent items are noted in HPI Denies cramping/contractions, leakage of fluid, vaginal bleeding, abnormal vaginal discharge w/ itching/odor/irritation, headaches, visual changes, shortness of breath, chest pain, abdominal pain, severe nausea/vomiting, or problems with urination or bowel movements unless otherwise stated above.  Pertinent History Reviewed:  Reviewed past medical,surgical, social, obstetrical and family history.  Reviewed problem list, medications and allergies. OB History  Gravida Para Term Preterm AB Living  3 2 2     2   SAB TAB Ectopic Multiple Live Births          2    # Outcome Date GA Lbr Len/2nd Weight Sex Delivery Anes PTL Lv  3 Current           2 Term 11/13/12 [redacted]w[redacted]d 03:19 7 lb 1.6 oz (3.221 kg) M Vag-Spont None N LIV  1 Term 07/03/09 [redacted]w[redacted]d  7 lb 8 oz (3.402 kg) M Vag-Spont EPI N LIV   Physical Assessment:   Vitals:   02/08/18 1429  BP: 101/63  Pulse: 85  Weight: 149 lb (67.6 kg)  Body mass index is 24.05 kg/m.       Physical Examination:  General appearance - well appearing, and in no distress  Mental status - alert, oriented to person, place, and time  Psych:  She has a  normal mood and affect  Skin - warm and dry, normal color, no suspicious lesions noted  Chest - effort normal, all lung fields clear to auscultation bilaterally  Heart - normal rate and regular rhythm  Abdomen - soft, nontender  Extremities:  No swelling or varicosities noted  Thin prep pap is not done   Fetal Heart Rate (bpm): 160 via doppler  Assessment & Plan:  1) Low-Risk Pregnancy G3P2002 at [redacted]w[redacted]d with an Estimated Date of Delivery: 06/30/18   2) Initial OB visit  3) Dep/anx/panic attacks> no meds, doing well  4) Hep C> recent dx, will get quant, CMP  5) Trichomonas> on further review of prenatals from [place] after pt left, will do POC next visit  6) Smoker> has cut down from 1/2ppd to 1pk q 3-4days, advised complete cessation  Meds: No orders of the defined types were placed in this encounter.   Initial labs reviewed, AFP, HCV quant, CMP ordered today- states she can't stay- will have to do when she comes back for u/s Continue prenatal vitamins Reviewed ptl s/s, fm Reviewed recommended weight gain based on pre-gravid BMI Encouraged well-balanced diet Genetic Screening discussed Quad Screen: requested Cystic fibrosis screening discussed neg Ultrasound discussed; fetal survey: requested CCNC completed>PCM not here Flu shot 11/01/17  Follow-up: Return for asap for anatomy u/s (no visit), then 4wks for LROB.  Orders Placed This Encounter  Procedures  . US OB Comp + 14 Wk  . AFP TETRA  . Pain Management Screening Profile (10S)  . HCV RNA quant  . Comprehensive metabolic panel  . POC Urinalysis Dipstick OB    Cheral Marker CNM, [facility] 02/08/2018

## 2018-02-08 NOTE — Patient Instructions (Signed)
Misty Reynolds, I greatly value your feedback.  If you receive a survey following your visit with [place] today, we appreciate you taking the time to fill it out.  Thanks, Joellyn Haff, CNM, WHNP-BC   Second Trimester of Pregnancy The second trimester is from week 14 through week 27 (months 4 through 6). The second trimester is often a time when you feel your best. Your body has adjusted to being pregnant, and you begin to feel better physically. Usually, morning sickness has lessened or quit completely, you may have more energy, and you may have an increase in appetite. The second trimester is also a time when the fetus is growing rapidly. At the end of the sixth month, the fetus is about 9 inches long and weighs about 1 pounds. You will likely begin to feel the baby move (quickening) between 16 and 20 weeks of pregnancy. Body changes during your second trimester Your body continues to go through many changes during your second trimester. The changes vary from woman to woman.  Your weight will continue to increase. You will notice your lower abdomen bulging out.  You may begin to get stretch marks on your hips, abdomen, and breasts.  You may develop headaches that can be relieved by medicines. The medicines should be approved by your health care provider.  You may urinate more often because the fetus is pressing on your bladder.  You may develop or continue to have heartburn as a result of your pregnancy.  You may develop constipation because certain hormones are causing the muscles that push waste through your intestines to slow down.  You may develop hemorrhoids or swollen, bulging veins (varicose veins).  You may have back pain. This is caused by: ? Weight gain. ? Pregnancy hormones that are relaxing the joints in your pelvis. ? A shift in weight and the muscles that support your balance.  Your breasts will continue to grow and they will continue to become tender.  Your gums may bleed and  may be sensitive to brushing and flossing.  Dark spots or blotches (chloasma, mask of pregnancy) may develop on your face. This will likely fade after the baby is born.  A dark line from your belly button to the pubic area (linea nigra) may appear. This will likely fade after the baby is born.  You may have changes in your hair. These can include thickening of your hair, rapid growth, and changes in texture. Some women also have hair loss during or after pregnancy, or hair that feels dry or thin. Your hair will most likely return to normal after your baby is born.  What to expect at prenatal visits During a routine prenatal visit:  You will be weighed to make sure you and the fetus are growing normally.  Your blood pressure will be taken.  Your abdomen will be measured to track your baby's growth.  The fetal heartbeat will be listened to.  Any test results from the previous visit will be discussed.  Your health care provider may ask you:  How you are feeling.  If you are feeling the baby move.  If you have had any abnormal symptoms, such as leaking fluid, bleeding, severe headaches, or abdominal cramping.  If you are using any tobacco products, including cigarettes, chewing tobacco, and electronic cigarettes.  If you have any questions.  Other tests that may be performed during your second trimester include:  Blood tests that check for: ? Low iron levels (anemia). ? High  blood sugar that affects pregnant women (gestational diabetes) between 3 and 28 weeks. ? Rh antibodies. This is to check for a protein on red blood cells (Rh factor).  Urine tests to check for infections, diabetes, or protein in the urine.  An ultrasound to confirm the proper growth and development of the baby.  An amniocentesis to check for possible genetic problems.  Fetal screens for spina bifida and Down syndrome.  HIV (human immunodeficiency virus) testing. Routine prenatal testing includes  screening for HIV, unless you choose not to have this test.  Follow these instructions at home: Medicines  Follow your health care provider's instructions regarding medicine use. Specific medicines may be either safe or unsafe to take during pregnancy.  Take a prenatal vitamin that contains at least 600 micrograms (mcg) of folic acid.  If you develop constipation, try taking a stool softener if your health care provider approves. Eating and drinking  Eat a balanced diet that includes fresh fruits and vegetables, whole grains, good sources of protein such as meat, eggs, or tofu, and low-fat dairy. Your health care provider will help you determine the amount of weight gain that is right for you.  Avoid raw meat and uncooked cheese. These carry germs that can cause birth defects in the baby.  If you have low calcium intake from food, talk to your health care provider about whether you should take a daily calcium supplement.  Limit foods that are high in fat and processed sugars, such as fried and sweet foods.  To prevent constipation: ? Drink enough fluid to keep your urine clear or pale yellow. ? Eat foods that are high in fiber, such as fresh fruits and vegetables, whole grains, and beans. Activity  Exercise only as directed by your health care provider. Most women can continue their usual exercise routine during pregnancy. Try to exercise for 30 minutes at least 5 days a week. Stop exercising if you experience uterine contractions.  Avoid heavy lifting, wear low heel shoes, and practice good posture.  A sexual relationship may be continued unless your health care provider directs you otherwise. Relieving pain and discomfort  Wear a good support bra to prevent discomfort from breast tenderness.  Take warm sitz baths to soothe any pain or discomfort caused by hemorrhoids. Use hemorrhoid cream if your health care provider approves.  Rest with your legs elevated if you have leg cramps  or low back pain.  If you develop varicose veins, wear support hose. Elevate your feet for 15 minutes, 3-4 times a day. Limit salt in your diet. Prenatal Care  Write down your questions. Take them to your prenatal visits.  Keep all your prenatal visits as told by your health care provider. This is important. Safety  Wear your seat belt at all times when driving.  Make a list of emergency phone numbers, including numbers for family, friends, the hospital, and police and fire departments. General instructions  Ask your health care provider for a referral to a local prenatal education class. Begin classes no later than the beginning of month 6 of your pregnancy.  Ask for help if you have counseling or nutritional needs during pregnancy. Your health care provider can offer advice or refer you to specialists for help with various needs.  Do not use hot tubs, steam rooms, or saunas.  Do not douche or use tampons or scented sanitary pads.  Do not cross your legs for long periods of time.  Avoid cat litter boxes and soil  used by cats. These carry germs that can cause birth defects in the baby and possibly loss of the fetus by miscarriage or stillbirth.  Avoid all smoking, herbs, alcohol, and unprescribed drugs. Chemicals in these products can affect the formation and growth of the baby.  Do not use any products that contain nicotine or tobacco, such as cigarettes and e-cigarettes. If you need help quitting, ask your health care provider.  Visit your dentist if you have not gone yet during your pregnancy. Use a soft toothbrush to brush your teeth and be gentle when you floss. Contact a health care provider if:  You have dizziness.  You have mild pelvic cramps, pelvic pressure, or nagging pain in the abdominal area.  You have persistent nausea, vomiting, or diarrhea.  You have a bad smelling vaginal discharge.  You have pain when you urinate. Get help right away if:  You have a  fever.  You are leaking fluid from your vagina.  You have spotting or bleeding from your vagina.  You have severe abdominal cramping or pain.  You have rapid weight gain or weight loss.  You have shortness of breath with chest pain.  You notice sudden or extreme swelling of your face, hands, ankles, feet, or legs.  You have not felt your baby move in over an hour.  You have severe headaches that do not go away when you take medicine.  You have vision changes. Summary  The second trimester is from week 14 through week 27 (months 4 through 6). It is also a time when the fetus is growing rapidly.  Your body goes through many changes during pregnancy. The changes vary from woman to woman.  Avoid all smoking, herbs, alcohol, and unprescribed drugs. These chemicals affect the formation and growth your baby.  Do not use any tobacco products, such as cigarettes, chewing tobacco, and e-cigarettes. If you need help quitting, ask your health care provider.  Contact your health care provider if you have any questions. Keep all prenatal visits as told by your health care provider. This is important. This information is not intended to replace advice given to you by your health care provider. Make sure you discuss any questions you have with your health care provider. Document Released: 01/11/2001 Document Revised: 06/25/2015 Document Reviewed: 03/20/2012 Elsevier Interactive Patient Education  2017 [employer].

## 2018-02-09 ENCOUNTER — Encounter: Payer: Self-pay | Admitting: Women's Health

## 2018-02-09 DIAGNOSIS — A599 Trichomoniasis, unspecified: Secondary | ICD-10-CM | POA: Insufficient documentation

## 2018-02-09 DIAGNOSIS — F1990 Other psychoactive substance use, unspecified, uncomplicated: Secondary | ICD-10-CM | POA: Insufficient documentation

## 2018-02-09 DIAGNOSIS — B1920 Unspecified viral hepatitis C without hepatic coma: Secondary | ICD-10-CM | POA: Insufficient documentation

## 2018-02-09 LAB — PMP SCREEN PROFILE (10S), URINE
Amphetamine Scrn, Ur: POSITIVE ng/mL — AB
BARBITURATE SCREEN URINE: NEGATIVE ng/mL
BENZODIAZEPINE SCREEN, URINE: NEGATIVE ng/mL
CANNABINOIDS UR QL SCN: NEGATIVE ng/mL
CREATININE(CRT), U: 130.4 mg/dL (ref 20.0–300.0)
Cocaine (Metab) Scrn, Ur: NEGATIVE ng/mL
Methadone Screen, Urine: NEGATIVE ng/mL
OXYCODONE+OXYMORPHONE UR QL SCN: NEGATIVE ng/mL
Opiate Scrn, Ur: NEGATIVE ng/mL
Ph of Urine: 6.2 (ref 4.5–8.9)
Phencyclidine Qn, Ur: NEGATIVE ng/mL
Propoxyphene Scrn, Ur: NEGATIVE ng/mL

## 2018-02-13 ENCOUNTER — Ambulatory Visit (INDEPENDENT_AMBULATORY_CARE_PROVIDER_SITE_OTHER): Payer: Medicaid Other

## 2018-02-13 DIAGNOSIS — Z363 Encounter for antenatal screening for malformations: Secondary | ICD-10-CM | POA: Diagnosis not present

## 2018-02-13 DIAGNOSIS — Z3482 Encounter for supervision of other normal pregnancy, second trimester: Secondary | ICD-10-CM

## 2018-02-13 NOTE — Progress Notes (Signed)
[place] 20+3 wks,breech,anterior placenta gr 0,normal ovaries bilat,svp of fluid 4.8 cm,fhr 159 bpm,efw 418 g 89%,anatomy complete,no obvious abnormalities

## 2018-02-14 LAB — HCV RNA QUANT
HCV log10: 5.301 log10 IU/mL
Hepatitis C Quantitation: 200000 IU/mL

## 2018-02-15 LAB — AFP TETRA
DIA Mom Value: 1.62
DIA Value (EIA): 325.59 pg/mL
DSR (By Age)    1 IN: 622
DSR (Second Trimester) 1 IN: 1852
Gestational Age: 20.3 WEEKS
MSAFP Mom: 1
MSAFP: 58.8 ng/mL
MSHCG Mom: 0.83
MSHCG: 19632 m[IU]/mL
Maternal Age At EDD: 30.9 yr
Osb Risk: 10000
T18 (By Age): 1:2422 {titer}
TEST RESULTS AFP: NEGATIVE
WEIGHT: 149 [lb_av]
uE3 Mom: 1.29
uE3 Value: 2.82 ng/mL

## 2018-03-06 ENCOUNTER — Encounter: Payer: Self-pay | Admitting: Obstetrics & Gynecology

## 2018-03-28 ENCOUNTER — Ambulatory Visit: Admitting: Obstetrics & Gynecology

## 2018-03-28 LAB — HX HIV 1/2 ANTIGEN/ANTIBODY COMBINATION ASS
CASE NUMBER: 2020057002442
HX HIV-1/HIV-2 AG/AB COMBO SCREEN: NONREACTIVE

## 2018-03-28 LAB — HX DRUGS OF ABUSE URINE 10
CASE NUMBER: 2020057002443
HX U AMPHETAMINES SCRN: NEGATIVE — NL
HX U BARBITURATE SCRN: NEGATIVE — NL
HX U BENZODIAZEPINE SCRN: NEGATIVE — NL
HX U CANNABINOID SCRN: NEGATIVE — NL
HX U COCAINE SCRN: NEGATIVE — NL
HX U ETHANOL INTERP: NEGATIVE — NL
HX U ETHANOL: <3 — NL (ref 0.0–49.0)
HX U FENTANYL SCRN: NEGATIVE — NL
HX U METHADONE SCRN: NEGATIVE — NL
HX U OPIATE 300 SCRN: NEGATIVE — NL
HX U PH FOR DAU: 7 — NL (ref 4.5–8.0)
HX U PHENCYCLIDINE SCRN: NEGATIVE — NL

## 2018-03-28 LAB — HX HEPATIC FUNCTION PANEL
CASE NUMBER: 2020057002441
HX ALBUMIN LVL: 3.2 g/dL — NL (ref 3.2–5.0)
HX ALKALINE PHOSPHATASE: 68 U/L — NL (ref 30.0–117.0)
HX ALT: 44 U/L — NL (ref 6.0–55.0)
HX AST: 26 U/L — NL (ref 6.0–40.0)
HX BILIRUBIN DIRECT: 0.1 mg/dL — NL (ref 0.0–0.3)
HX BILIRUBIN TOTAL: 0.3 mg/dL — NL (ref 0.2–1.2)
HX TOTAL PROTEIN: 7.3 g/dL — NL (ref 6.0–8.4)

## 2018-03-28 LAB — HX CBC W/ INDICES
CASE NUMBER: 2020057002441
HX ABSOLUTE NRBC COUNT: 0 10*3/uL
HX HCT: 36.5 % — NL (ref 36.0–47.0)
HX HGB: 12.5 g/dL — NL (ref 11.8–16.0)
HX MCH: 30.6 pg — NL (ref 26.0–34.0)
HX MCHC: 34.2 g/dL — NL (ref 31.0–37.0)
HX MCV: 89.5 fL — NL (ref 80.0–100.0)
HX MPV: 11.8 fL — NL (ref 9.4–12.4)
HX NRBC PERCENT: 0 % — NL
HX PLATELET: 173 10*3/uL — NL (ref 150.0–400.0)
HX RBC: 4.08 10*6/uL — NL (ref 3.9–5.2)
HX RDW-CV: 13.2 % — NL (ref 11.5–14.5)
HX RDW-SD: 43.4 fL — NL (ref 35.0–51.0)
HX WBC: 8.9 10*3/uL — NL (ref 3.7–11.2)

## 2018-03-29 LAB — HX HEPATITIS C ANTIBODY
CASE NUMBER: 2020057002441
HX HEP C AB INST: REACTIVE — AB
HX HEP C AB MEASURED: >11 — ABNORMAL HIGH (ref 0.0–0.8)
HX HEP C AB: REACTIVE — AB

## 2018-03-29 LAB — HX SEXUALLY TRAN DIS (AMP PRB)
CASE NUMBER: 2020057002444
HX C TRACHOMATIS DNA: NOT DETECTED — NL
HX N. GONORRHOEAE DNA: NOT DETECTED — NL
HX TOTAL RLU: 10

## 2018-03-29 LAB — HX .MDPH REPORTABLES
CASE NUMBER: 2020057002441
HX MDPH HEPCAB: POSITIVE

## 2018-03-29 LAB — HX ANTIBODY SCREEN
CASE NUMBER: 2020057002441
HX ANTIBODY SCREEN AUTOMATED: NEGATIVE — NL

## 2018-03-29 LAB — HX ABO/RH TYPE
CASE NUMBER: 2020057002441
HX ABO/RH TYPE: O POS — NL

## 2018-03-30 LAB — HX HEPATITIS C QUANT BY RT-PCR
CASE NUMBER: 2020057002441
HX HCV QNT BY RT-PCR: 191097 [IU]/mL — AB
HX HCV QNT INTERP: DETECTED — AB
HX HCV QNT LOG: 5.3 — AB

## 2018-03-30 LAB — HX BUPRENORPHINE QUANT/CONF URINE
CASE NUMBER: 2020057002443
HX U BUPRENORPHINE: NEGATIVE
HX U NORBUPRENORPHINE: NEGATIVE

## 2018-04-01 LAB — HX HEPATITIS C GENOTYPING: CASE NUMBER: 2020057002441

## 2018-04-03 ENCOUNTER — Ambulatory Visit: Admitting: Internal Medicine

## 2018-04-03 NOTE — Progress Notes (Signed)
 * * Jaime Perez **DOB:** [birth year] (33 yo F) **Acc No.** [id number] **DOS:**  04/03/2018    ---        Jaime Perez**    ------    33 Y old Female, DOB: 01-Apr-1985    Account Number: [id number]    [address] , [place], [id number]    Home: [phone]    Guarantor: [employer] [place] Insurance: [employer] - [name] Payer ID:  [id number]    Appointment Facility: [facility]        * * *    04/03/2018    Progress Notes: Sharyn Lull, MD    ------    ---        **Current Medications**    ---    Not-Taking      * Trazodone HCl 150 MG Tablet 1 tablet at bedtime Orally Once a day     ---    * Medication List reviewed and reconciled with the patient     ---      Past Medical History    ---      Anxiety/depression.        ---    Insomnia.        ---      **Social History**    ---    _General:_    Smoking    Patient is a: _current tobacco user pt. stated that she smokes about 4  ciggerettes a day. Pt. stated that she started at age 20._    Alcohol Screening    Did you have a drink containing alcohol in the past year? _No_    Points _0_    Interpretation _Negative_      **Allergies**    ---      N.K.D.A.    ---      **Review of Systems**    ---    _CONSTITUTIONAL_ :    Patient denies  fatigue  ,  fever  ,  chills  ,  loss of appetite  ,  weight  change  ,  night sweats  .    _MUSCULOSKELETAL_ :    Patient complaining of  Right middle finger pain, swelling, redness, no  discharge.  .            **Reason for Appointment**    ---      1\. Infection in finger    ---    2\. The patient is coming with complaining of some infection of right middle  finger.    ---      **History of Present Illness**    ---    _2015 MSSP_ :    The patient has right middle finger at end, on nailbed, the patient has some  infection with redness, swelling and pain. The patient had injury on the right  middle finger too. Denies any fever or chills.      **Vital Signs**    ---    Counseling 1, Wt 174.5, Ht  66, BMI  28.16  , BP 120/82, HR 71, SaO2 98.      **Examination**    ---    _General Examination:_    SKIN:  Right middle finger-Swollen, redness, tender.  .          **Assessments**    ---    1\. Cellulitis, unspecified - L03.90 (Primary), Right Middle Finger    ---      **Treatment**    ---      **  1\. Cellulitis, unspecified**    Start Keflex Capsule, 500 MG, 1 capsule, Orally, every 12 hrs, 10 day(s), 20,  Refills 0    Start Ibuprofen Tablet, 600 MG, 1 tablet with food or milk as needed, Orally,  tid prn, 10 day(s), 30, Refills 0    Notes: Patient advised to do warm compression. Take pain meds as needed. Calll  if increase in symptoms Advised to take antibiotics as prescribed with food or  milk. Advised to take medication with food or milk as medication can cause  stomach upset. Patient is warned that long term use cam cause stomach ulcer  and can damage kidney.    ---      **Follow Up**    ---    prn    Electronically signed by Sharyn Lull , MD on 06/22/2018 at 07:26 AM EDT    Sign off status: Completed        * * [facility]    [address]    [address]    [place], [place] [address]    Tel: [phone]    Fax: [phone]              * * *          Progress Note: Sharyn Lull, MD 04/03/2018    ---    Note generated by eClinicalWorks EMR/PM Software ([email or web address])

## 2018-05-01 ENCOUNTER — Ambulatory Visit: Admitting: Obstetrics & Gynecology

## 2018-05-15 ENCOUNTER — Ambulatory Visit: Admitting: Family

## 2018-05-23 ENCOUNTER — Ambulatory Visit

## 2018-05-30 ENCOUNTER — Ambulatory Visit

## 2018-09-22 ENCOUNTER — Encounter (HOSPITAL_COMMUNITY): Payer: Self-pay
